# Patient Record
Sex: Female | Born: 1980 | Race: White | Hispanic: No | Marital: Single | Smoking: Never smoker
Health system: Southern US, Community
[De-identification: ages and names within clinical notes are randomized; demographics above are authoritative.]

## PROBLEM LIST (undated history)

## (undated) DIAGNOSIS — J45909 Unspecified asthma, uncomplicated: Secondary | ICD-10-CM

## (undated) DIAGNOSIS — H9190 Unspecified hearing loss, unspecified ear: Secondary | ICD-10-CM

## (undated) HISTORY — PX: OTHER SURGICAL HISTORY: SHX169

## (undated) HISTORY — PX: EYE SURGERY: SHX253

## (undated) HISTORY — PX: CLEFT PALATE REPAIR: SUR1165

## (undated) HISTORY — PX: CHOLECYSTECTOMY: SHX55

---

## 2014-01-31 ENCOUNTER — Emergency Department (HOSPITAL_COMMUNITY): Payer: Medicare Other

## 2014-01-31 ENCOUNTER — Emergency Department (HOSPITAL_COMMUNITY)
Admission: EM | Admit: 2014-01-31 | Discharge: 2014-01-31 | Disposition: A | Discharge status: Home / Self Care | Payer: Medicare Other | Attending: Emergency Medicine | Admitting: Emergency Medicine

## 2014-01-31 ENCOUNTER — Encounter (HOSPITAL_COMMUNITY): Payer: Self-pay

## 2014-01-31 DIAGNOSIS — R05 Cough: Secondary | ICD-10-CM

## 2014-01-31 DIAGNOSIS — R059 Cough, unspecified: Secondary | ICD-10-CM

## 2014-01-31 DIAGNOSIS — J45901 Unspecified asthma with (acute) exacerbation: Secondary | ICD-10-CM | POA: Insufficient documentation

## 2014-01-31 HISTORY — DX: Unspecified asthma, uncomplicated: J45.909

## 2014-01-31 MED ORDER — ALBUTEROL (5 MG/ML) CONTINUOUS INHALATION SOLN: 10.0000 mg/h | INHALATION_SOLUTION | Freq: Once | RESPIRATORY_TRACT | Status: DC

## 2014-01-31 MED ORDER — DM-GUAIFENESIN ER 30-600 MG PO TB12: 1.0000 | ORAL_TABLET | Freq: Two times a day (BID) | ORAL | Status: DC

## 2014-01-31 MED ORDER — ALBUTEROL SULFATE (2.5 MG/3ML) 0.083% IN NEBU
5.0000 mg | INHALATION_SOLUTION | Freq: Once | RESPIRATORY_TRACT | Status: AC
  Administered 2014-01-31: 5 mg via RESPIRATORY_TRACT
  Filled 2014-01-31: qty 6

## 2014-01-31 NOTE — ED Provider Notes (Signed)
CSN: [id number]     Arrival date & time 01/31/14  1118 History   First MD Initiated Contact with Patient 01/31/14 1225     Chief Complaint  Patient presents with  . Cough  . Emesis     (Consider location/radiation/quality/duration/timing/severity/associated sxs/prior Treatment) HPI Brandy Freeman is a 33 y.o. female with a history of asthma comes in for evaluation of cough. Patient is deaf and history of present illness is given via sign language interpreter. Patient states for the past week she has had a cough that is somewhat productive. She denies any hemoptysis. She also reports a runny nose, but "it is very mild". She tried taking over-the-counter cold and flu medication for 3 days, but that seemed to worsen her symptoms. She tried using her albuterol inhaler last night to help with her breathing and that provided some relief. She clarifies her vomiting as "just spitting up after coughing bouts". She reports many of her friends are sick with upper respiratory viruses and she feels like she has the same thing. She denies any fevers, overt nausea or vomiting, abdominal pain, chest pain, shortness of breath, sore throat.   Past Medical History  Diagnosis Date  . Asthma    Past Surgical History  Procedure Laterality Date  . Cleft palate repair    . [place]    . Eye surgery     History reviewed. No pertinent family history. History  Substance Use Topics  . Smoking status: Never Smoker   . Smokeless tobacco: Not on file  . Alcohol Use: No   OB History    No data available     Review of Systems  Constitutional: Negative for fever.  HENT: Negative for sore throat.   Eyes: Negative for visual disturbance.  Respiratory: Positive for cough. Negative for shortness of breath.   Cardiovascular: Negative for chest pain.  Gastrointestinal: Negative for abdominal pain.  Endocrine: Negative for polyuria.  Genitourinary: Negative for dysuria.  Skin: Negative for rash.  Neurological:  Negative for headaches.      Allergies  Review of patient's allergies indicates no known allergies.  Home Medications   Prior to Admission medications   Medication Sig Start Date End Date Taking? Authorizing Provider  ibuprofen (ADVIL,MOTRIN) 200 MG tablet Take 200-800 mg by mouth every 6 (six) hours as needed for fever or moderate pain.   Yes Historical Provider, MD  Nutritional Supplements (COLD AND FLU PO) Take 15-30 mLs by mouth every 4 (four) hours as needed (for cold symptoms).   Yes Historical Provider, MD  dextromethorphan-guaiFENesin (MUCINEX DM) 30-600 MG per 12 hr tablet Take 1 tablet by mouth 2 (two) times daily. 01/31/14   Earle GellBenjamin W Faaris Arizpe, PA-C   BP 120/87 mmHg  Pulse 79  Temp(Src) 98.1 F (36.7 C) (Oral)  Resp 18  SpO2 99%  LMP  Physical Exam  Constitutional: She is oriented to person, place, and time. She appears well-developed and well-nourished. No distress.  HENT:  Head: Normocephalic and atraumatic.  Mouth/Throat: Oropharynx is clear and moist. No oropharyngeal exudate.  Eyes: Conjunctivae are normal. Pupils are equal, round, and reactive to light. Right eye exhibits no discharge. Left eye exhibits no discharge. No scleral icterus.  Neck: Normal range of motion. Neck supple. No tracheal deviation present.  Cardiovascular: Normal rate, regular rhythm, normal heart sounds and intact distal pulses.  Exam reveals no gallop and no friction rub.   No murmur heard. Pulmonary/Chest: Effort normal and breath sounds normal. No stridor. No respiratory distress. She  has no wheezes. She has no rales.  Mild diffuse wheezing in all fields. No other adventitious lung sounds. Chest rise appropriate and symmetrical with respiration. No evidence of respiratory distress.  Abdominal: Soft. There is no tenderness.  Musculoskeletal: Normal range of motion. She exhibits no edema or tenderness.  Lymphadenopathy:    She has no cervical adenopathy.  Neurological: She is alert and  oriented to person, place, and time.  Cranial Nerves II-XII grossly intact  Skin: Skin is warm and dry. No rash noted. She is not diaphoretic.  Psychiatric: She has a normal mood and affect.  Nursing note and vitals reviewed.   ED Course  Procedures (including critical care time) Labs Review Labs Reviewed - No data to display  Imaging Review Dg Chest 2 View  01/31/2014   CLINICAL DATA:  Cough, shortness of breath and wheezing.  EXAM: CHEST  2 VIEW  COMPARISON:  03/06/2013.  FINDINGS: Normal sized heart. Clear lungs. Mild central peribronchial thickening with mild progression. The previously seen nodular densities on the right are no longer demonstrated. Unremarkable bones.  IMPRESSION: Mild bronchitic changes with mild progression.   Electronically Signed   By: Gordan PaymentSteve  Reid M.D.   On: 01/31/2014 13:08     EKG Interpretation None     Meds given in ED:  Medications  albuterol (PROVENTIL) (2.5 MG/3ML) 0.083% nebulizer solution 5 mg (5 mg Nebulization Given 01/31/14 1234)    New Prescriptions   DEXTROMETHORPHAN-GUAIFENESIN (MUCINEX DM) 30-600 MG PER 12 HR TABLET    Take 1 tablet by mouth 2 (two) times daily.   Filed Vitals:   01/31/14 1137  BP: 120/87  Pulse: 79  Temp: 98.1 F (36.7 C)  TempSrc: Oral  Resp: 18  SpO2: 99%    MDM  Vitals stable - WNL -afebrile, O2 saturations 99% Pt resting comfortably in ED. states she feels better after breathing treatment and does not want another one. Says she would prefer to go home and continue to manage cough symptomatically. Reports she still has albuterol inhaler at home and does not need a refill PE-patient still has mild wheezing diffusely after albuterol, but no evidence of respiratory distress. No concern for other acute or emergent pathology  Imaging-chest x-ray shows mild bronchitic changes but no focal pneumonias or effusions.  Will DC with antitussives Discussed f/u with PCP and return precautions, pt very amenable to  plan.   Final diagnoses:  Cough        Sharlene MottsBenjamin W Jawanda Passey, PA-C 01/31/14 1852  Dione Boozeavid Glick, MD 02/01/14 [phone]

## 2014-01-31 NOTE — Discharge Instructions (Signed)
Cough, Adult  A cough is a reflex that helps clear your throat and airways. It can help heal the body or may be a reaction to an irritated airway. A cough may only last 2 or 3 weeks (acute) or may last more than 8 weeks (chronic).  CAUSES Acute cough:  Viral or bacterial infections. Chronic cough:  Infections.  Allergies.  Asthma.  Post-nasal drip.  Smoking.  Heartburn or acid reflux.  Some medicines.  Chronic lung problems (COPD).  Cancer. SYMPTOMS   Cough.  Fever.  Chest pain.  Increased breathing rate.  High-pitched whistling sound when breathing (wheezing).  Colored mucus that you cough up (sputum). TREATMENT   A bacterial cough may be treated with antibiotic medicine.  A viral cough must run its course and will not respond to antibiotics.  Your caregiver may recommend other treatments if you have a chronic cough. HOME CARE INSTRUCTIONS   Only take over-the-counter or prescription medicines for pain, discomfort, or fever as directed by your caregiver. Use cough suppressants only as directed by your caregiver.  Use a cold steam vaporizer or humidifier in your bedroom or home to help loosen secretions.  Sleep in a semi-upright position if your cough is worse at night.  Rest as needed.  Stop smoking if you smoke. SEEK IMMEDIATE MEDICAL CARE IF:   You have pus in your sputum.  Your cough starts to worsen.  You cannot control your cough with suppressants and are losing sleep.  You begin coughing up blood.  You have difficulty breathing.  You develop pain which is getting worse or is uncontrolled with medicine.  You have a fever. MAKE SURE YOU:   Understand these instructions.  Will watch your condition.  Will get help right away if you are not doing well or get worse. Document Released: 08/20/2010 Document Revised: 05/16/2011 Document Reviewed: 08/20/2010 [employer] Patient Information 2015 [place] [place]. This information is not intended  to replace advice given to you by your health care provider. Make sure you discuss any questions you have with your health care provider.   You were seen in the ED today for your cough. There was no emergent pathology discovered for your cough symptoms. Please take your medication as directed to manage your cough. You may follow-up with primary care within the next 3-5 days for further evaluation and management. Return to ER for worsening symptoms, difficulties breathing or shortness of breath, fevers at home.

## 2014-01-31 NOTE — ED Notes (Signed)
Pt is deaf.  Pt writes that she has had a cough for 1 week.  Vomiting with cough.  Using inhaler with no help.  No fever at home

## 2014-01-31 NOTE — ED Notes (Signed)
Interpreter in with patient.

## 2014-04-19 ENCOUNTER — Emergency Department (HOSPITAL_COMMUNITY)
Admission: EM | Admit: 2014-04-19 | Discharge: 2014-04-19 | Disposition: A | Discharge status: Home / Self Care | Payer: Medicare Other | Attending: Emergency Medicine | Admitting: Emergency Medicine

## 2014-04-19 ENCOUNTER — Encounter (HOSPITAL_COMMUNITY): Payer: Self-pay | Admitting: Emergency Medicine

## 2014-04-19 DIAGNOSIS — J45909 Unspecified asthma, uncomplicated: Secondary | ICD-10-CM | POA: Insufficient documentation

## 2014-04-19 DIAGNOSIS — Y9289 Other specified places as the place of occurrence of the external cause: Secondary | ICD-10-CM | POA: Insufficient documentation

## 2014-04-19 DIAGNOSIS — S39012A Strain of muscle, fascia and tendon of lower back, initial encounter: Secondary | ICD-10-CM | POA: Diagnosis not present

## 2014-04-19 DIAGNOSIS — Y998 Other external cause status: Secondary | ICD-10-CM | POA: Diagnosis not present

## 2014-04-19 DIAGNOSIS — T148XXA Other injury of unspecified body region, initial encounter: Secondary | ICD-10-CM

## 2014-04-19 DIAGNOSIS — Z79899 Other long term (current) drug therapy: Secondary | ICD-10-CM | POA: Diagnosis not present

## 2014-04-19 DIAGNOSIS — Y9389 Activity, other specified: Secondary | ICD-10-CM | POA: Insufficient documentation

## 2014-04-19 DIAGNOSIS — X58XXXA Exposure to other specified factors, initial encounter: Secondary | ICD-10-CM | POA: Diagnosis not present

## 2014-04-19 DIAGNOSIS — S3992XA Unspecified injury of lower back, initial encounter: Secondary | ICD-10-CM | POA: Diagnosis present

## 2014-04-19 MED ORDER — HYDROCODONE-ACETAMINOPHEN 5-325 MG PO TABS
1.0000 | ORAL_TABLET | Freq: Once | ORAL | Status: AC
  Administered 2014-04-19: 1 via ORAL
  Filled 2014-04-19: qty 1

## 2014-04-19 MED ORDER — CYCLOBENZAPRINE HCL 5 MG PO TABS: 5.0000 mg | ORAL_TABLET | Freq: Three times a day (TID) | ORAL | Status: DC | PRN

## 2014-04-19 MED ORDER — CYCLOBENZAPRINE HCL 10 MG PO TABS
5.0000 mg | ORAL_TABLET | Freq: Once | ORAL | Status: AC
  Administered 2014-04-19: 5 mg via ORAL
  Filled 2014-04-19: qty 1

## 2014-04-19 MED ORDER — HYDROCODONE-ACETAMINOPHEN 5-325 MG PO TABS: 1.0000 | ORAL_TABLET | ORAL | Status: DC | PRN

## 2014-04-19 NOTE — ED Notes (Signed)
Sign language interpreter at bedside  
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
## 2014-04-19 NOTE — Discharge Instructions (Signed)

## 2014-04-30 ENCOUNTER — Emergency Department (HOSPITAL_COMMUNITY): Payer: Medicare Other

## 2014-04-30 ENCOUNTER — Emergency Department (HOSPITAL_COMMUNITY)
Admission: EM | Admit: 2014-04-30 | Discharge: 2014-04-30 | Disposition: A | Discharge status: Home / Self Care | Payer: Medicare Other | Attending: Emergency Medicine | Admitting: Emergency Medicine

## 2014-04-30 ENCOUNTER — Encounter (HOSPITAL_COMMUNITY): Payer: Self-pay | Admitting: Emergency Medicine

## 2014-04-30 DIAGNOSIS — R1110 Vomiting, unspecified: Secondary | ICD-10-CM | POA: Diagnosis present

## 2014-04-30 DIAGNOSIS — Z3202 Encounter for pregnancy test, result negative: Secondary | ICD-10-CM | POA: Diagnosis not present

## 2014-04-30 DIAGNOSIS — J45901 Unspecified asthma with (acute) exacerbation: Secondary | ICD-10-CM | POA: Insufficient documentation

## 2014-04-30 DIAGNOSIS — N12 Tubulo-interstitial nephritis, not specified as acute or chronic: Secondary | ICD-10-CM | POA: Insufficient documentation

## 2014-04-30 DIAGNOSIS — Z79899 Other long term (current) drug therapy: Secondary | ICD-10-CM | POA: Insufficient documentation

## 2014-04-30 LAB — CBC WITH DIFFERENTIAL/PLATELET
Basophils Absolute: 0 10*3/uL (ref 0.0–0.1)
Basophils Relative: 0 % (ref 0–1)
Eosinophils Absolute: 0 10*3/uL (ref 0.0–0.7)
Eosinophils Relative: 0 % (ref 0–5)
HCT: 30.4 % — ABNORMAL LOW (ref 36.0–46.0)
Hemoglobin: 10.1 g/dL — ABNORMAL LOW (ref 12.0–15.0)
Lymphocytes Relative: 5 % — ABNORMAL LOW (ref 12–46)
Lymphs Abs: 0.7 10*3/uL (ref 0.7–4.0)
MCH: 29.1 pg (ref 26.0–34.0)
MCHC: 33.2 g/dL (ref 30.0–36.0)
MCV: 87.6 fL (ref 78.0–100.0)
Monocytes Absolute: 1.3 10*3/uL — ABNORMAL HIGH (ref 0.1–1.0)
Monocytes Relative: 10 % (ref 3–12)
Neutro Abs: 11.4 10*3/uL — ABNORMAL HIGH (ref 1.7–7.7)
Neutrophils Relative %: 85 % — ABNORMAL HIGH (ref 43–77)
Platelets: 155 10*3/uL (ref 150–400)
RBC: 3.47 MIL/uL — ABNORMAL LOW (ref 3.87–5.11)
RDW: 13.3 % (ref 11.5–15.5)
WBC: 13.4 10*3/uL — ABNORMAL HIGH (ref 4.0–10.5)

## 2014-04-30 LAB — URINE MICROSCOPIC-ADD ON

## 2014-04-30 LAB — COMPREHENSIVE METABOLIC PANEL
ALT: 68 U/L — ABNORMAL HIGH (ref 0–35)
AST: 60 U/L — ABNORMAL HIGH (ref 0–37)
Albumin: 3.1 g/dL — ABNORMAL LOW (ref 3.5–5.2)
Alkaline Phosphatase: 108 U/L (ref 39–117)
Anion gap: 10 (ref 5–15)
BUN: 19 mg/dL (ref 6–23)
CO2: 26 mmol/L (ref 19–32)
Calcium: 9.6 mg/dL (ref 8.4–10.5)
Chloride: 97 mmol/L (ref 96–112)
Creatinine, Ser: 0.68 mg/dL (ref 0.50–1.10)
GFR calc Af Amer: >90 mL/min (ref >90)
GFR calc non Af Amer: >90 mL/min (ref >90)
Glucose, Bld: 115 mg/dL — ABNORMAL HIGH (ref 70–99)
Potassium: 3.1 mmol/L — ABNORMAL LOW (ref 3.5–5.1)
Sodium: 133 mmol/L — ABNORMAL LOW (ref 135–145)
Total Bilirubin: 1 mg/dL (ref 0.3–1.2)
Total Protein: 7.2 g/dL (ref 6.0–8.3)

## 2014-04-30 LAB — URINALYSIS, ROUTINE W REFLEX MICROSCOPIC
Glucose, UA: NEGATIVE mg/dL
Ketones, ur: 40 mg/dL — AB
Nitrite: POSITIVE — AB
Protein, ur: 100 mg/dL — AB
Specific Gravity, Urine: 1.022 (ref 1.005–1.030)
Urobilinogen, UA: 2 mg/dL — ABNORMAL HIGH (ref 0.0–1.0)
pH: 5.5 (ref 5.0–8.0)

## 2014-04-30 LAB — LIPASE, BLOOD: Lipase: 28 U/L (ref 11–59)

## 2014-04-30 LAB — POC URINE PREG, ED: Preg Test, Ur: NEGATIVE

## 2014-04-30 MED ORDER — SODIUM CHLORIDE 0.9 % IV BOLUS (SEPSIS)
1000.0000 mL | Freq: Once | INTRAVENOUS | Status: AC
  Administered 2014-04-30: 1000 mL via INTRAVENOUS

## 2014-04-30 MED ORDER — PROMETHAZINE HCL 25 MG PO TABS: 25.0000 mg | ORAL_TABLET | Freq: Four times a day (QID) | ORAL | Status: DC | PRN

## 2014-04-30 MED ORDER — ONDANSETRON HCL 4 MG/2ML IJ SOLN
4.0000 mg | Freq: Once | INTRAMUSCULAR | Status: AC
  Administered 2014-04-30: 4 mg via INTRAVENOUS
  Filled 2014-04-30: qty 2

## 2014-04-30 MED ORDER — KETOROLAC TROMETHAMINE 60 MG/2ML IM SOLN
60.0000 mg | Freq: Once | INTRAMUSCULAR | Status: AC
  Administered 2014-04-30: 60 mg via INTRAMUSCULAR
  Filled 2014-04-30: qty 2

## 2014-04-30 MED ORDER — PHENAZOPYRIDINE HCL 200 MG PO TABS: 200.0000 mg | ORAL_TABLET | Freq: Three times a day (TID) | ORAL | Status: DC

## 2014-04-30 MED ORDER — CEPHALEXIN 500 MG PO CAPS: 500.0000 mg | ORAL_CAPSULE | Freq: Three times a day (TID) | ORAL | Status: DC

## 2014-04-30 MED ORDER — IBUPROFEN 600 MG PO TABS: 600.0000 mg | ORAL_TABLET | Freq: Four times a day (QID) | ORAL | Status: DC | PRN

## 2014-04-30 MED ORDER — IPRATROPIUM-ALBUTEROL 0.5-2.5 (3) MG/3ML IN SOLN
3.0000 mL | Freq: Once | RESPIRATORY_TRACT | Status: AC
  Administered 2014-04-30: 3 mL via RESPIRATORY_TRACT
  Filled 2014-04-30: qty 3

## 2014-04-30 MED ORDER — CEFTRIAXONE SODIUM 1 G IJ SOLR
1.0000 g | Freq: Once | INTRAMUSCULAR | Status: AC
  Administered 2014-04-30: 1 g via INTRAMUSCULAR
  Filled 2014-04-30: qty 10

## 2014-04-30 NOTE — ED Notes (Signed)
Nurse drawing labs. 

## 2014-04-30 NOTE — ED Notes (Signed)
Pt is deaf and mute, uses sign language. Pt c/o vomiting and not feeling good for the past 5 days, states she has been shaking.

## 2014-04-30 NOTE — ED Notes (Signed)
Gave pt Sprite. Pt taking sips at this time.

## 2014-04-30 NOTE — ED Notes (Signed)
Patient is unable to give urine sample at this time. 

## 2014-04-30 NOTE — ED Notes (Signed)
Patient getting a breathing treatment when she finish the treatment she will try to give us a sample

## 2014-04-30 NOTE — ED Provider Notes (Signed)
CSN: [id number]     Arrival date & time 04/30/14  1519 History   First MD Initiated Contact with Patient 04/30/14 1545     Chief Complaint  Patient presents with  . Emesis     (Consider location/radiation/quality/duration/timing/severity/associated sxs/prior Treatment) The history is provided by the patient. The history is limited by a language barrier. A language interpreter was used (Sign language interpreter).   Pt is a 34yo female with hx of asthma, deaf and mute, uses sign language, presenting to ED with c/o vomiting and not feeling good for 5 days. Pt states she believes she has a UTI as she has had increased urinary urgency and frequency for 5 days.  She has also had generalized cramping, abdominal pain, nausea and vomiting. States she cannot keep anything down.  No associated diarrhea. She has had cough, congestion and SOB with left sided chest pain with her cough. She has had hot and cold chills, taking ibuprofen and acetaminophen with moderate relief.  Family in room was also sick with vomiting 2 days ago that required IV fluids but they are currently pregnant and not sure if the n/v was due to the pregnancy or sickness.  No recent travel. No hx of abdominal surgeries. No hx of kidney stones. Denies vaginal symptoms.     Past Medical History  Diagnosis Date  . Asthma    Past Surgical History  Procedure Laterality Date  . Cleft palate repair    . [place]    . Eye surgery     No family history on file. History  Substance Use Topics  . Smoking status: Never Smoker   . Smokeless tobacco: Not on file  . Alcohol Use: No   OB History    No data available     Review of Systems  Constitutional: Positive for fever, chills, appetite change and fatigue. Negative for diaphoresis.  HENT: Positive for congestion.   Respiratory: Positive for cough, chest tightness, shortness of breath and wheezing. Negative for stridor.   Cardiovascular: Positive for chest pain ( left side with  breathing). Negative for palpitations and leg swelling.  Gastrointestinal: Positive for nausea, vomiting and abdominal pain ( generalized). Negative for diarrhea.  Genitourinary: Positive for dysuria, urgency and frequency. Negative for hematuria, flank pain, decreased urine volume, vaginal bleeding, vaginal discharge, vaginal pain and pelvic pain.  Musculoskeletal: Positive for back pain ( lower back). Negative for myalgias, neck pain and neck stiffness.  All other systems reviewed and are negative.     Allergies  Review of patient's allergies indicates no known allergies.  Home Medications   Prior to Admission medications   Medication Sig Start Date End Date Taking? Authorizing Provider  albuterol (PROVENTIL HFA;VENTOLIN HFA) 108 (90 BASE) MCG/ACT inhaler Inhale 2 puffs into the lungs every 6 (six) hours as needed for wheezing or shortness of breath (wheezing and shortness of breath).    Yes Historical Provider, MD  cyclobenzaprine (FLEXERIL) 5 MG tablet Take 1 tablet (5 mg total) by mouth 3 (three) times daily as needed for muscle spasms. 04/19/14  Yes Teressa LowerVrinda Pickering, NP  dextromethorphan-guaiFENesin (MUCINEX DM) 30-600 MG per 12 hr tablet Take 1 tablet by mouth 2 (two) times daily. 01/31/14  Yes Earle GellBenjamin W Cartner, PA-C  HYDROcodone-acetaminophen (NORCO/VICODIN) 5-325 MG per tablet Take 1-2 tablets by mouth every 4 (four) hours as needed. 04/19/14  Yes Teressa LowerVrinda Pickering, NP  ibuprofen (ADVIL,MOTRIN) 200 MG tablet Take 200 mg by mouth every 6 (six) hours as needed for fever or moderate  pain (back pain).    Yes Historical Provider, MD  cephALEXin (KEFLEX) 500 MG capsule Take 1 capsule (500 mg total) by mouth 3 (three) times daily. 04/30/14   Junius Finner, PA-C  ibuprofen (ADVIL,MOTRIN) 600 MG tablet Take 1 tablet (600 mg total) by mouth every 6 (six) hours as needed. 04/30/14   Junius Finner, PA-C  phenazopyridine (PYRIDIUM) 200 MG tablet Take 1 tablet (200 mg total) by mouth 3 (three) times  daily. 04/30/14   Junius Finner, PA-C  promethazine (PHENERGAN) 25 MG tablet Take 1 tablet (25 mg total) by mouth every 6 (six) hours as needed for nausea or vomiting. 04/30/14   Junius Finner, PA-C   BP 116/87 mmHg  Pulse 97  Temp(Src) 100.1 F (37.8 C) (Oral)  Resp 17  SpO2 96% Physical Exam  Constitutional: She appears well-developed and well-nourished. She appears distressed.  Pt lying in exam bed, tearful  HENT:  Head: Normocephalic and atraumatic.  Eyes: Conjunctivae are normal. No scleral icterus.  Neck: Normal range of motion. Neck supple.  Cardiovascular: Normal rate, regular rhythm and normal heart sounds.   Pulmonary/Chest: Effort normal and breath sounds normal. No respiratory distress. She has no wheezes. She has no rales. She exhibits no tenderness.  Abdominal: Soft. Bowel sounds are normal. She exhibits no distension and no mass. There is tenderness (diffuse). There is CVA tenderness ( left). There is no rebound and no guarding.  Soft, non-distended. Diffuse tenderness. Left: CVAT  Musculoskeletal: Normal range of motion.  Neurological: She is alert.  Skin: Skin is warm and dry. She is not diaphoretic.  Nursing note and vitals reviewed.   ED Course  Procedures (including critical care time) Labs Review Labs Reviewed  CBC WITH DIFFERENTIAL/PLATELET - Abnormal; Notable for the following:    WBC 13.4 (*)    RBC 3.47 (*)    Hemoglobin 10.1 (*)    HCT 30.4 (*)    Neutrophils Relative % 85 (*)    Neutro Abs 11.4 (*)    Lymphocytes Relative 5 (*)    Monocytes Absolute 1.3 (*)    All other components within normal limits  COMPREHENSIVE METABOLIC PANEL - Abnormal; Notable for the following:    Sodium 133 (*)    Potassium 3.1 (*)    Glucose, Bld 115 (*)    Albumin 3.1 (*)    AST 60 (*)    ALT 68 (*)    All other components within normal limits  URINALYSIS, ROUTINE W REFLEX MICROSCOPIC - Abnormal; Notable for the following:    Color, Urine AMBER (*)    APPearance  CLOUDY (*)    Hgb urine dipstick SMALL (*)    Bilirubin Urine SMALL (*)    Ketones, ur 40 (*)    Protein, ur 100 (*)    Urobilinogen, UA 2.0 (*)    Nitrite POSITIVE (*)    Leukocytes, UA MODERATE (*)    All other components within normal limits  URINE MICROSCOPIC-ADD ON - Abnormal; Notable for the following:    Squamous Epithelial / LPF FEW (*)    Bacteria, UA MANY (*)    Casts HYALINE CASTS (*)    All other components within normal limits  URINE CULTURE  LIPASE, BLOOD  POC URINE PREG, ED    Imaging Review Dg Chest 2 View  04/30/2014   CLINICAL DATA:  34 year old female with vomiting and fever for 5 days. Initial encounter.  EXAM: CHEST  2 VIEW  COMPARISON:  01/31/2014 and earlier.  FINDINGS: Lower lung  volumes. Normal cardiac size and mediastinal contours. Visualized tracheal air column is within normal limits. No pneumothorax or pneumoperitoneum. The lungs are clear. No pleural effusion. Negative visible bowel gas pattern. No osseous abnormality identified.  IMPRESSION: Mildly lower lung volumes otherwise Negative, no acute cardiopulmonary abnormality.   Electronically Signed   By: Odessa Fleming M.D.   On: 04/30/2014 16:55     EKG Interpretation None      MDM   Final diagnoses:  Pyelonephritis  Asthma exacerbation    Pt with hx of asthma presenting to ED with c/o cough, SOB, as well as urinary symptoms, abdominal pain and vomiting for 5 days.  Labs: mild hypokalemia of 3.1, mild leukocytosis 13.4, mild elevation in AST/ALT w/o focal tenderness to RUQ.  Low concern for cholecystitis.  Abdomen is soft, non-distended, diffuse tenderness. Improved with IV zofran and IV fluids.  Pt requested IV removed after receiving . Duoneb given which did help with SOB, Lungs: CTAB.  CXR: mildly lower lung volumes, otherwise, negative. No acute cardiopulmonary abnormality.   UA: significant for evidence of UTI with positive nitrites, moderate leukocytes, many bacteria.    With pt having  vomiting and left CVAT, will tx for pyelonephrosis with longer duration of keflex.  Rocephin 1g IM given in ED.  Pt able to keep down PO fluids. Stable for discharge home to f/u with PCP.   Return precautions provided. Pt verbalized understanding and agreement with tx plan.        Junius Finner, PA-C 04/30/14 1924  Gwyneth Sprout, MD 05/02/14 [phone]

## 2014-05-04 LAB — URINE CULTURE: Colony Count: >=100000

## 2014-05-05 ENCOUNTER — Telehealth (HOSPITAL_BASED_OUTPATIENT_CLINIC_OR_DEPARTMENT_OTHER): Payer: Self-pay | Admitting: Emergency Medicine

## 2014-05-05 NOTE — Telephone Encounter (Signed)
Post ED Visit - Positive Culture Follow-up  Culture report reviewed by antimicrobial stewardship pharmacist: []  Wes Dulaney, Pharm.D., BCPS [x]  Celedonio MiyamotoJeremy Frens, Pharm.D., BCPS []  Georgina PillionElizabeth Martin, Pharm.D., BCPS []  Mesquite CreekMinh Pham, [address]Ph[address]D., BCPS, AAHIVP []  Estella HuskMichelle Turner, Pharm.D., BCPS, AAHIVP []  Elder CyphersLorie Poole, [address]Pharm.D., BCPS  Positive urine culture E. Coli Treated with cephalexin, organism sensitive to the same and no further patient follow-up is required at this time.  Berle MullMiller, Venida Tsukamoto 05/05/2014, 10:47 AM

## 2014-07-25 ENCOUNTER — Emergency Department (HOSPITAL_COMMUNITY): Payer: Medicare Other

## 2014-07-25 ENCOUNTER — Emergency Department (HOSPITAL_COMMUNITY)
Admission: EM | Admit: 2014-07-25 | Discharge: 2014-07-25 | Disposition: A | Discharge status: Home / Self Care | Payer: Medicare Other | Attending: Emergency Medicine | Admitting: Emergency Medicine

## 2014-07-25 ENCOUNTER — Encounter (HOSPITAL_COMMUNITY): Payer: Self-pay | Admitting: *Deleted

## 2014-07-25 DIAGNOSIS — J45909 Unspecified asthma, uncomplicated: Secondary | ICD-10-CM | POA: Diagnosis not present

## 2014-07-25 DIAGNOSIS — Z79899 Other long term (current) drug therapy: Secondary | ICD-10-CM | POA: Diagnosis not present

## 2014-07-25 DIAGNOSIS — Z792 Long term (current) use of antibiotics: Secondary | ICD-10-CM | POA: Insufficient documentation

## 2014-07-25 DIAGNOSIS — J029 Acute pharyngitis, unspecified: Secondary | ICD-10-CM | POA: Diagnosis present

## 2014-07-25 DIAGNOSIS — J40 Bronchitis, not specified as acute or chronic: Secondary | ICD-10-CM

## 2014-07-25 MED ORDER — BENZONATATE 100 MG PO CAPS: 100.0000 mg | ORAL_CAPSULE | Freq: Three times a day (TID) | ORAL | Status: DC

## 2014-07-25 NOTE — ED Notes (Signed)
Patient communicates via sign interpreter that she has had a cold and cough with sore throat for about a week. Her boyfriend has had similar symptoms. Patient has only begun taking over the counter nyquil last night. Patient has been coughing a lot and unable to sleep.

## 2014-07-25 NOTE — Discharge Instructions (Signed)

## 2014-07-25 NOTE — ED Provider Notes (Signed)
CSN: [id number]     Arrival date & time 07/25/14  0801 History   First MD Initiated Contact with Patient 07/25/14 [phone]     Chief Complaint  Patient presents with  . Cough  . Sore Throat      HPI Patient presents emergency department with productive cough over the past several days.  She reports upper respiratory symptoms as well as chills at home without documented fever.  Her boyfriend has similar symptoms and was recently seen in the emergency department 30 minutes ago for similar symptoms.  Patient decided to check herself in.  She denies nausea or vomiting.  No diarrhea.  No chest pain.  Denies shortness of breath.  She does have a history of asthma but has not tried her albuterol inhaler yet.   Past Medical History  Diagnosis Date  . Asthma    Past Surgical History  Procedure Laterality Date  . Cleft palate repair    . [place]    . Eye surgery     No family history on file. History  Substance Use Topics  . Smoking status: Never Smoker   . Smokeless tobacco: Not on file  . Alcohol Use: No   OB History    No data available     Review of Systems  All other systems reviewed and are negative.     Allergies  Review of patient's allergies indicates no known allergies.  Home Medications   Prior to Admission medications   Medication Sig Start Date End Date Taking? Authorizing Provider  albuterol (PROVENTIL HFA;VENTOLIN HFA) 108 (90 BASE) MCG/ACT inhaler Inhale 2 puffs into the lungs every 6 (six) hours as needed for wheezing or shortness of breath (wheezing and shortness of breath).    Yes Historical Provider, MD  ibuprofen (ADVIL,MOTRIN) 200 MG tablet Take 400 mg by mouth every 4 (four) hours as needed for fever or moderate pain (back pain).    Yes Historical Provider, MD  Phenyleph-Doxylamine-DM-APAP 5-6.25-10-325 MG/15ML LIQD Take 30 mLs by mouth at bedtime as needed (for sleep/cold).   Yes Historical Provider, MD  cephALEXin (KEFLEX) 500 MG capsule Take 1 capsule  (500 mg total) by mouth 3 (three) times daily. Patient not taking: Reported on 07/25/2014 04/30/14   Junius FinnerErin O'Malley, PA-C  cyclobenzaprine (FLEXERIL) 5 MG tablet Take 1 tablet (5 mg total) by mouth 3 (three) times daily as needed for muscle spasms. Patient not taking: Reported on 07/25/2014 04/19/14   Teressa LowerVrinda Pickering, NP  dextromethorphan-guaiFENesin [facility](MUCINEX DM) 30-600 MG per 12 hr tablet Take 1 tablet by mouth 2 (two) times daily. Patient not taking: Reported on 07/25/2014 01/31/14   Joycie PeekBenjamin Cartner, PA-C  HYDROcodone-acetaminophen (NORCO/VICODIN) 5-325 MG per tablet Take 1-2 tablets by mouth every 4 (four) hours as needed. Patient not taking: Reported on 07/25/2014 04/19/14   Teressa LowerVrinda Pickering, NP  ibuprofen (ADVIL,MOTRIN) 600 MG tablet Take 1 tablet (600 mg total) by mouth every 6 (six) hours as needed. Patient not taking: Reported on 07/25/2014 04/30/14   Junius FinnerErin O'Malley, PA-C  phenazopyridine (PYRIDIUM) 200 MG tablet Take 1 tablet (200 mg total) by mouth 3 (three) times daily. Patient not taking: Reported on 07/25/2014 04/30/14   Junius FinnerErin O'Malley, PA-C  promethazine (PHENERGAN) 25 MG tablet Take 1 tablet (25 mg total) by mouth every 6 (six) hours as needed for nausea or vomiting. Patient not taking: Reported on 07/25/2014 04/30/14   Junius FinnerErin O'Malley, PA-C   BP 105/80 mmHg  Pulse 72  Temp(Src) 97.7 F (36.5 C) (Oral)  Resp 16  SpO2 100% Physical Exam  Constitutional: She is oriented to person, place, and time. She appears well-developed and well-nourished. No distress.  HENT:  Head: Normocephalic and atraumatic.  Eyes: EOM are normal.  Neck: Normal range of motion.  Cardiovascular: Normal rate, regular rhythm and normal heart sounds.   Pulmonary/Chest: Effort normal and breath sounds normal. She has no wheezes.  Abdominal: Soft. She exhibits no distension. There is no tenderness.  Musculoskeletal: Normal range of motion.  Neurological: She is alert and oriented to person, place, and time.  Skin: Skin  is warm and dry.  Psychiatric: She has a normal mood and affect. Judgment normal.  Nursing note and vitals reviewed.   ED Course  Procedures (including critical care time) Labs Review Labs Reviewed - No data to display  Imaging Review Dg Chest 2 View (if Patient Has Fever And/or Copd)  07/25/2014   CLINICAL DATA:  Cough, chest congestion, and sore throat.  EXAM: CHEST  2 VIEW  COMPARISON:  04/30/2014  FINDINGS: The heart size and mediastinal contours are within normal limits. Both lungs are clear. The visualized skeletal structures are unremarkable.  IMPRESSION: Normal chest.   Electronically Signed   By: Francene BoyersJames  Maxwell M.D.   On: 07/25/2014 09:18     EKG Interpretation None      MDM   Final diagnoses:  None    Suspect bronchitis with bronchospasm.  The patient took 2 puffs of her own albuterol here in the emergency department with some improvement in her symptoms.  X-rays without focal pneumonia or other pathology.  Discharge home in good condition.    Azalia BilisKevin Tonjia Parillo, MD 07/25/14 [phone]

## 2014-09-04 ENCOUNTER — Emergency Department (HOSPITAL_COMMUNITY)
Admission: EM | Admit: 2014-09-04 | Discharge: 2014-09-04 | Disposition: A | Discharge status: Home / Self Care | Payer: Medicare Other | Attending: Emergency Medicine | Admitting: Emergency Medicine

## 2014-09-04 ENCOUNTER — Encounter (HOSPITAL_COMMUNITY): Payer: Self-pay | Admitting: Emergency Medicine

## 2014-09-04 DIAGNOSIS — G8921 Chronic pain due to trauma: Secondary | ICD-10-CM | POA: Insufficient documentation

## 2014-09-04 DIAGNOSIS — J45909 Unspecified asthma, uncomplicated: Secondary | ICD-10-CM | POA: Insufficient documentation

## 2014-09-04 DIAGNOSIS — Z792 Long term (current) use of antibiotics: Secondary | ICD-10-CM | POA: Diagnosis not present

## 2014-09-04 DIAGNOSIS — Z3202 Encounter for pregnancy test, result negative: Secondary | ICD-10-CM | POA: Diagnosis not present

## 2014-09-04 DIAGNOSIS — M545 Low back pain: Secondary | ICD-10-CM | POA: Diagnosis present

## 2014-09-04 DIAGNOSIS — Z79899 Other long term (current) drug therapy: Secondary | ICD-10-CM | POA: Insufficient documentation

## 2014-09-04 DIAGNOSIS — N10 Acute tubulo-interstitial nephritis: Secondary | ICD-10-CM | POA: Diagnosis not present

## 2014-09-04 LAB — COMPREHENSIVE METABOLIC PANEL
ALK PHOS: 82 U/L (ref 38–126)
ALT: 13 U/L — ABNORMAL LOW (ref 14–54)
AST: 19 U/L (ref 15–41)
Albumin: 4.1 g/dL (ref 3.5–5.0)
Anion gap: 7 (ref 5–15)
BILIRUBIN TOTAL: 0.5 mg/dL (ref 0.3–1.2)
BUN: 23 mg/dL — AB (ref 6–20)
CO2: 27 mmol/L (ref 22–32)
CREATININE: 0.88 mg/dL (ref 0.44–1.00)
Calcium: 9.8 mg/dL (ref 8.9–10.3)
Chloride: 104 mmol/L (ref 101–111)
GFR calc non Af Amer: >60 mL/min (ref >60)
Glucose, Bld: 119 mg/dL — ABNORMAL HIGH (ref 65–99)
Potassium: 3.9 mmol/L (ref 3.5–5.1)
Sodium: 138 mmol/L (ref 135–145)
Total Protein: 8 g/dL (ref 6.5–8.1)

## 2014-09-04 LAB — CBC WITH DIFFERENTIAL/PLATELET
BASOS PCT: 0 % (ref 0–1)
Basophils Absolute: 0 10*3/uL (ref 0.0–0.1)
Eosinophils Absolute: 0.2 10*3/uL (ref 0.0–0.7)
Eosinophils Relative: 2 % (ref 0–5)
HCT: 39.1 % (ref 36.0–46.0)
Hemoglobin: 12.9 g/dL (ref 12.0–15.0)
Lymphocytes Relative: 7 % — ABNORMAL LOW (ref 12–46)
Lymphs Abs: 0.6 10*3/uL — ABNORMAL LOW (ref 0.7–4.0)
MCH: 29.4 pg (ref 26.0–34.0)
MCHC: 33 g/dL (ref 30.0–36.0)
MCV: 89.1 fL (ref 78.0–100.0)
Monocytes Absolute: 0.6 10*3/uL (ref 0.1–1.0)
Monocytes Relative: 7 % (ref 3–12)
NEUTROS ABS: 7.5 10*3/uL (ref 1.7–7.7)
Neutrophils Relative %: 84 % — ABNORMAL HIGH (ref 43–77)
Platelets: 140 10*3/uL — ABNORMAL LOW (ref 150–400)
RBC: 4.39 MIL/uL (ref 3.87–5.11)
RDW: 14.3 % (ref 11.5–15.5)
WBC: 8.9 10*3/uL (ref 4.0–10.5)

## 2014-09-04 LAB — URINALYSIS, ROUTINE W REFLEX MICROSCOPIC
BILIRUBIN URINE: NEGATIVE
GLUCOSE, UA: NEGATIVE mg/dL
Ketones, ur: NEGATIVE mg/dL
NITRITE: POSITIVE — AB
PROTEIN: NEGATIVE mg/dL
Specific Gravity, Urine: 1.017 (ref 1.005–1.030)
UROBILINOGEN UA: 0.2 mg/dL (ref 0.0–1.0)
pH: 5.5 (ref 5.0–8.0)

## 2014-09-04 LAB — LIPASE, BLOOD: Lipase: 33 U/L (ref 22–51)

## 2014-09-04 LAB — URINE MICROSCOPIC-ADD ON

## 2014-09-04 LAB — POC URINE PREG, ED: Preg Test, Ur: NEGATIVE

## 2014-09-04 MED ORDER — CEPHALEXIN 500 MG PO CAPS: 500.0000 mg | ORAL_CAPSULE | Freq: Four times a day (QID) | ORAL | Status: DC

## 2014-09-04 MED ORDER — ONDANSETRON HCL 4 MG/2ML IJ SOLN
4.0000 mg | Freq: Once | INTRAMUSCULAR | Status: DC
  Filled 2014-09-04 (×2): qty 2

## 2014-09-04 MED ORDER — ACETAMINOPHEN 500 MG PO TABS
1000.0000 mg | ORAL_TABLET | Freq: Once | ORAL | Status: AC
  Administered 2014-09-04: 1000 mg via ORAL
  Filled 2014-09-04: qty 2

## 2014-09-04 MED ORDER — SODIUM CHLORIDE 0.9 % IV BOLUS (SEPSIS)
1000.0000 mL | Freq: Once | INTRAVENOUS | Status: AC
  Administered 2014-09-04: 1000 mL via INTRAVENOUS

## 2014-09-04 MED ORDER — MORPHINE SULFATE 4 MG/ML IJ SOLN
4.0000 mg | Freq: Once | INTRAMUSCULAR | Status: AC
  Administered 2014-09-04: 4 mg via INTRAVENOUS
  Filled 2014-09-04 (×2): qty 1

## 2014-09-04 MED ORDER — ONDANSETRON HCL 4 MG PO TABS: 4.0000 mg | ORAL_TABLET | Freq: Three times a day (TID) | ORAL | Status: DC | PRN

## 2014-09-04 MED ORDER — HYDROCODONE-ACETAMINOPHEN 5-325 MG PO TABS: 1.0000 | ORAL_TABLET | ORAL | Status: DC | PRN

## 2014-09-04 MED ORDER — DEXTROSE 5 % IV SOLN
1.0000 g | Freq: Once | INTRAVENOUS | Status: AC
  Administered 2014-09-04: 1 g via INTRAVENOUS
  Filled 2014-09-04: qty 10
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
## 2014-09-04 NOTE — ED Notes (Signed)
Patient refused Zofran.

## 2014-09-04 NOTE — Discharge Instructions (Signed)
Read the information below.  Use the prescribed medication as directed.  Please discuss all new medications with your pharmacist.  Do not take additional tylenol while taking the prescribed pain medication to avoid overdose.  You may return to the Emergency Department at any time for worsening condition or any new symptoms that concern you.   If you develop high fevers, worsening back or abdominal pain, uncontrolled vomiting, or are unable to tolerate fluids by mouth, return to the ER for a recheck.     Pyelonephritis, Adult Pyelonephritis is a kidney infection. A kidney infection can happen quickly, or it can last for a long time. HOME CARE   Take your medicine (antibiotics) as told. Finish it even if you start to feel better.  Keep all doctor visits as told.  Drink enough fluids to keep your pee (urine) clear or pale yellow.  Only take medicine as told by your doctor. GET HELP RIGHT AWAY IF:   You have a fever or lasting symptoms for more than 2-3 days.  You have a fever and your symptoms suddenly get worse.  You cannot take your medicine or drink fluids as told.  You have chills and shaking.  You feel very weak or pass out (faint).  You do not feel better after 2 days. MAKE SURE YOU:  Understand these instructions.  Will watch your condition.  Will get help right away if you are not doing well or get worse. Document Released: 03/31/2004 Document Revised: 08/23/2011 Document Reviewed: 08/11/2010 [facility] Patient Information 2015 [place], [place]. This information is not intended to replace advice given to you by your health care provider. Make sure you discuss any questions you have with your health care provider.   Emergency Department Resource Guide 1) Find a Doctor and Pay Out of Pocket Although you won't have to find out who is covered by your insurance plan, it is a good idea to ask around and get recommendations. You will then need to call the office and see if the  doctor you have chosen will accept you as a new patient and what types of options they offer for patients who are self-pay. Some doctors offer discounts or will set up payment plans for their patients who do not have insurance, but you will need to ask so you aren't surprised when you get to your appointment.  2) Contact Your Local Health Department Not all health departments have doctors that can see patients for sick visits, but many do, so it is worth a call to see if yours does. If you don't know where your local health department is, you can check in your phone book. The CDC also has a tool to help you locate your state's health department, and many state websites also have listings of all of their local health departments.  3) Find a Walk-in Clinic If your illness is not likely to be very severe or complicated, you may want to try a walk in clinic. These are popping up all over the country in pharmacies, drugstores, and shopping centers. They're usually staffed by nurse practitioners or physician assistants that have been trained to treat common illnesses and complaints. They're usually fairly quick and inexpensive. However, if you have serious medical issues or chronic medical problems, these are probably not your best option.  No Primary Care Doctor: - Call Health Connect at  [phone] - they can help you locate a primary care doctor that  accepts your insurance, provides certain services, etc. - Physician  Referral Service- [phone]  Chronic Pain Problems: Organization         Address  Phone   Notes  [facility]  [phone] Patients need to be referred by their primary care doctor.   Medication Assistance: Organization         Address  Phone   Notes  [facility] Medication [facility] [address] [place] [place], [place] [address] [phone] --Must be a resident of [facility] -- Must have NO insurance coverage whatsoever (no Medicaid/  Medicare, etc.) -- The pt. MUST have a primary care doctor that directs their care regularly and follows them in the community   MedAssist  [phone]   [employer]  [phone]    Agencies that provide inexpensive medical care: Organization         Address  Phone   Notes  [facility]  [phone]   [facility]    [phone]   [facility] [address] [place], [place] [address] [phone]   [facility] [address], [place] [phone]   Planned Parenthood    [phone]   [facility]    [phone]   [facility]  [address], [place] Phone:  [phone], Fax:  [phone] Hours of Operation:  9 am - 6 pm, M-F.  Also accepts Medicaid/Medicare and self-pay.  [facility]  [address], [place] Phone: [phone], Fax: [phone]. Hours of Operation:  8:30 am - 5:30 pm, M-F.  Also accepts Medicaid and self-pay.  [facility] [place] [address], [place] Phone: [phone]   [facility] [address] Natasha Bence [place], [place] [phone], Ext. [phone] Mondays & Thursdays: 7-9 AM.  First 15 patients are seen on a first come, first serve basis.    Medicaid-accepting [facility] Providers:  Organization         Address  Phone   Notes  [facility] [address], [place] [phone] Also accepts self-pay patients.  [facility] [address] Laurell Josephs [place], [place]  [phone]   [facility] [address], [place] [phone]   [facility] [address], [place] [phone]   Renaye Rakers [address], [place]   [phone] Only accepts [place] Access [place] patients after they have their name applied to their card.    Self-Pay (no insurance) in [facility]:  Organization         Address  Phone   Notes  Sickle Cell Patients, [facility] Internal Medicine [address] [place], [place] [phone]   [facility] [address] [place], [place] [phone]   [facility] [place]  [address], [place] [phone]   [facility]/Dr. Osei-Bonsu  [address] [place] or [address], [place] [phone] Phone number for both [place] and [place] locations is the same.  [facility] [address], [place] [phone]   [facility] [address] [place] or [address] [phone]   [facility] [address] [place], [place] [phone], phone; [phone], fax Sees patients 1st and 3rd Saturday of every month.  Must not qualify for public or private insurance (i.e. Medicaid, Medicare, [place] Health Choice, Veterans' Benefits)  Household income should be no more than 200% of the poverty level The clinic cannot treat you if you are pregnant or think you are pregnant  Sexually transmitted diseases are not treated at the clinic.    Dental Care: Organization         Address  Phone  Notes  [facility] [address], [place] [phone] Accepts children up to age 36 who are enrolled in [place] or [place] Health Choice; pregnant women with a Medicaid card; and children who have applied for Medicaid or [place] Health Choice, but were declined, whose parents can pay a reduced fee at time of service.  [facility] [place]  [address], [place] [phone] Accepts children up to age 36 who are enrolled in [place] or [place] Health Choice; pregnant women with a Medicaid card; and children who have applied for Medicaid or [place] Health Choice,  but were declined, whose parents can pay a reduced fee at time of service.  [facility]  [address], [place] [phone] Patients are seen by appointment only. Walk-ins are not accepted. [facility] will see patients 718 years of age and older. Monday - Tuesday (8am-5pm) Most Wednesdays (8:30-5pm) $30 per visit, cash only  [facility]  [address], [facility] [place] [phone] Patients are seen by appointment only. Walk-ins are not accepted. [facility] will see patients 36 years of age and older. One Wednesday Evening (Monthly: Volunteer Based).  $30 per visit, cash only  [employer] [facility]  [phone] for adults; Children under age 514, call Graduate Pediatric Dentistry at [phone]. Children aged 674-14, please call [phone] to request a pediatric application.  Dental services are provided in all areas of dental care including fillings, crowns and bridges, complete and partial dentures, implants, gum treatment, root canals, and extractions. Preventive care is also provided. Treatment is provided to both adults and children. Patients are selected via a lottery and there is often a waiting list.   [facility] [address], [place]  [phone] [email or web address]   [facility] [address], [place], [place] [phone], Ext. [phone] Second and Fourth Thursday of each month, opens at 6:30 AM; Clinic ends at 9 AM.  Patients are seen on a first-come first-served basis, and a limited number are seen during each clinic.   [facility]  [address], [place], [place] [phone]   Eligibility Requirements You must have lived in [place], [place], or [place] counties for at least the last three months.   You cannot be eligible for state or federal sponsored [place] Cityhealthcare insurance, including CIGNAVeterans Administration, [place]icaid, or [employer]icare.   You generally  cannot be eligible for healthcare insurance through your employer.    How to apply: Eligibility screenings are held every Tuesday and Wednesday afternoon from 1:00 pm until 4:00 pm. You do not need an appointment for the interview!  [facility] [address], [place], [place] [phone]   [facility]  [phone]   [facility]  [phone]   [facility]  [phone]    Behavioral Health Resources in the  Community: Intensive Outpatient Production manager         Address  Phone  Notes  [facility] [address], [place], [place] [phone]   [facility] Outpatient [address], [place], [place] [phone]   [facility] [address] [place], [place]  [phone]   [facility] [address],  [place], [place] [phone] or [phone]   Substance Abuse Resources Organization         Address  Phone  Notes  [facility] Drug [facility]  [phone]   [facility]  [phone]   [facility] [place]  [phone]   Floydene Flock  [phone]   Residential & Outpatient Substance Abuse Program  [phone]   Psychological Services Organization         Address  Phone  Notes  [facility]  [phone]   [facility]  [phone]   [facility] [address] [place] [phone] or [phone]    Mobile Crisis Teams Organization         Address  Phone  Notes  [facility], Mobile Crisis Care Unit  [phone]   [facility]  [address] [place], [place] [phone]   Doristine Locks [address] [place] [phone]    Self-Help/Support Groups Organization         Address  Phone             Notes  [facility] [place] - variety of support groups  336- I7437963 Call for more  information  Narcotics Anonymous (NA), [facility] [address], [employer] [place]  2 meetings at this location   Statistician         Address  Phone  Notes  [facility] [address],    [place]  [phone]   [facility]  [address], [place] [address], [place], [place] [phone]   [facility] [address] [place], [place] [phone] Admissions: 8am-3pm M-F  [facility] [address],    [place], [place] [phone]   [facility] [address] [place], [place], [place] [phone]   [facility] [address]  [place], [place] [phone]   [facility] - Intensive Outpatient [address]ure[address], [place], [place] [phone]   [facility] ([facility] [address]  [place], [place] [phone] or [phone]   [facility] ([facility]) [address] [place], [place] [phone] Accepts Medicaid  [facility] [address]  [place] [phone] Substance Abuse/Addiction Treatment   [facility] Organization         Address  Phone  Notes  [facility]  [phone]   Angie Fava, PhD [address] Ervin Knack [place], [place]   [phone] or [phone]   [facility]   [address] [place], [place] [phone]   [facility] [address], [place], [place] [phone] Insurance/Medicaid/sponsorship through [employer] and Families [address]                                    [place], [place] [phone] Therapy/tele-psych/case  [facility] [address] [place], [place] [phone]  Dr. Adele Schilder  [phone]   [facility] Dept. 1) [address], [place] 2) [place] 3)  [place], [place] [phone]  [phone]   [place] [phone] or [phone] (After Hours)

## 2014-09-04 NOTE — ED Notes (Signed)
Interpreter Service replied, they are looking for interpreter, will call when one is available.

## 2014-09-04 NOTE — ED Notes (Signed)
Patient given water for po/fluid challenge.  

## 2014-09-04 NOTE — ED Notes (Signed)
Interpreter in the room.

## 2014-09-04 NOTE — ED Notes (Signed)
Called for sign language translator  Pt is now c/o nausea in triage

## 2014-09-04 NOTE — ED Notes (Signed)
Pt is refusing to take zofran and morphine at this time, will keep order in incase she changes her mind.

## 2014-09-04 NOTE — Progress Notes (Signed)
[facility] Pt with interpreter present.  CM consult from ED RN for pcp Pt has medicare coverage confirmed in EPIC CM attempted to call pt to discuss pcps at [phone] This number [phone] is for [facility] not pt There is no other number listed for pt but her significant other and he is ? Deaf also 1136 CM left a voice message via telephone interpreting services for Karren BurlyMichael Lundgren [phone] requesting a return all to CM mobile number.   Pt with 4 ED visits in last 6 months

## 2014-09-04 NOTE — ED Notes (Signed)
Pt is deaf  Pt's friend writes pt is having back pain and chills  Sxs started 4 days ago and has been constant  Pt is pale and shaking

## 2014-09-07 LAB — URINE CULTURE: Culture: >=100000

## 2014-09-09 ENCOUNTER — Telehealth (HOSPITAL_COMMUNITY): Payer: Self-pay

## 2014-09-09 NOTE — Telephone Encounter (Signed)
Post ED Visit - Positive Culture Follow-up  Culture report reviewed by antimicrobial stewardship pharmacist: []  Wes Dulaney, Pharm.D., BCPS [x]  Celedonio MiyamotoJeremy Frens, Pharm.D., BCPS []  Georgina PillionElizabeth Martin, Pharm.D., BCPS []  [place]ady HillsMinh Pham, [address]Pharm.D., BCPS, AAHIVP []  Estella HuskMichelle Turner, Pharm.D., BCPS, AAHIVP []  Elder CyphersLorie Poole, [address] BoulevardPharm.D., BCPS  Positive Urine culture, >/= 100,000 colonies -> E Coli Treated with Cephalexin, organism sensitive to the same and no further patient follow-up is required at this time.  Arvid RightClark, Clarice Zulauf Dorn 09/09/2014, 6:22 AM

## 2014-11-15 ENCOUNTER — Emergency Department (HOSPITAL_COMMUNITY)
Admission: EM | Admit: 2014-11-15 | Discharge: 2014-11-16 | Disposition: A | Discharge status: Home / Self Care | Payer: Medicare Other | Attending: Emergency Medicine | Admitting: Emergency Medicine

## 2014-11-15 ENCOUNTER — Encounter (HOSPITAL_COMMUNITY): Payer: Self-pay | Admitting: Emergency Medicine

## 2014-11-15 DIAGNOSIS — J45909 Unspecified asthma, uncomplicated: Secondary | ICD-10-CM | POA: Diagnosis not present

## 2014-11-15 DIAGNOSIS — Y9289 Other specified places as the place of occurrence of the external cause: Secondary | ICD-10-CM | POA: Diagnosis not present

## 2014-11-15 DIAGNOSIS — Y998 Other external cause status: Secondary | ICD-10-CM | POA: Diagnosis not present

## 2014-11-15 DIAGNOSIS — Z792 Long term (current) use of antibiotics: Secondary | ICD-10-CM | POA: Insufficient documentation

## 2014-11-15 DIAGNOSIS — G8918 Other acute postprocedural pain: Secondary | ICD-10-CM | POA: Diagnosis present

## 2014-11-15 DIAGNOSIS — T40601A Poisoning by unspecified narcotics, accidental (unintentional), initial encounter: Secondary | ICD-10-CM

## 2014-11-15 DIAGNOSIS — Y9389 Activity, other specified: Secondary | ICD-10-CM | POA: Diagnosis not present

## 2014-11-15 DIAGNOSIS — T402X1A Poisoning by other opioids, accidental (unintentional), initial encounter: Secondary | ICD-10-CM | POA: Insufficient documentation

## 2014-11-15 DIAGNOSIS — H9190 Unspecified hearing loss, unspecified ear: Secondary | ICD-10-CM | POA: Insufficient documentation

## 2014-11-15 DIAGNOSIS — Z79899 Other long term (current) drug therapy: Secondary | ICD-10-CM | POA: Insufficient documentation

## 2014-11-15 HISTORY — DX: Unspecified hearing loss, unspecified ear: H91.90

## 2014-11-15 LAB — CBC WITH DIFFERENTIAL/PLATELET
BASOS PCT: 0 % (ref 0–1)
Basophils Absolute: 0 10*3/uL (ref 0.0–0.1)
Eosinophils Absolute: 0.1 10*3/uL (ref 0.0–0.7)
Eosinophils Relative: 1 % (ref 0–5)
HCT: 39.1 % (ref 36.0–46.0)
HEMOGLOBIN: 13.1 g/dL (ref 12.0–15.0)
Lymphocytes Relative: 32 % (ref 12–46)
Lymphs Abs: 3.5 10*3/uL (ref 0.7–4.0)
MCH: 30 pg (ref 26.0–34.0)
MCHC: 33.5 g/dL (ref 30.0–36.0)
MCV: 89.7 fL (ref 78.0–100.0)
MONO ABS: 0.8 10*3/uL (ref 0.1–1.0)
Monocytes Relative: 7 % (ref 3–12)
NEUTROS ABS: 6.6 10*3/uL (ref 1.7–7.7)
Neutrophils Relative %: 60 % (ref 43–77)
Platelets: 172 10*3/uL (ref 150–400)
RBC: 4.36 MIL/uL (ref 3.87–5.11)
RDW: 14 % (ref 11.5–15.5)
WBC: 10.9 10*3/uL — AB (ref 4.0–10.5)

## 2014-11-15 NOTE — ED Provider Notes (Signed)
CSN: [id number]     Arrival date & time 11/15/14  1908 History   This chart was scribed for Dione Booze, MD by Octavia Heir, ED Scribe. This patient was seen in room WA12/WA12 and the patient's care was started at 12:21 PM.    Chief Complaint  Patient presents with  . post op pain       The history is provided by the patient. A language interpreter was used Estate manager/land agent).   HPI Comments: Timothy Townsel is a 34 y.o. female has a hx of asthma and being Deaf who presents to the Emergency Department complaining of sudden onset post op pain onset yesterday. She complains of an electric shock in her right shoulder that radiates to the right side of the chest. She has been having associated chills. Pt had gall bladder surgery yesterday morning and has taken .5 of her hydrocodone that was prescribed. She notes the pain did not start until after she took the medication. Pt was worried she was allergic to the medication. Pt notes laying down makes her pain worse. Pt denies nausea and fever.  Past Medical History  Diagnosis Date  . Asthma   . Deaf    Past Surgical History  Procedure Laterality Date  . Cleft palate repair    . [place]    . Eye surgery     No family history on file. Social History  Substance Use Topics  . Smoking status: Never Smoker   . Smokeless tobacco: None  . Alcohol Use: No   OB History    No data available     Review of Systems  Gastrointestinal: Positive for abdominal pain.  All other systems reviewed and are negative.     Allergies  Review of patient's allergies indicates no known allergies.  Home Medications   Prior to Admission medications   Medication Sig Start Date End Date Taking? Authorizing Provider  albuterol (PROVENTIL HFA;VENTOLIN HFA) 108 (90 BASE) MCG/ACT inhaler Inhale 2 puffs into the lungs every 6 (six) hours as needed for wheezing or shortness of breath (wheezing and shortness of breath).     Historical Provider, MD   benzonatate (TESSALON) 100 MG capsule Take 1 capsule (100 mg total) by mouth every 8 (eight) hours. Patient not taking: Reported on 09/04/2014 07/25/14   Azalia Bilis, MD  cephALEXin (KEFLEX) 500 MG capsule Take 1 capsule (500 mg total) by mouth 4 (four) times daily. 09/04/14   Trixie Dredge, PA-C  cyclobenzaprine (FLEXERIL) 5 MG tablet Take 1 tablet (5 mg total) by mouth 3 (three) times daily as needed for muscle spasms. Patient not taking: Reported on 07/25/2014 04/19/14   Teressa Lower, NP  HYDROcodone-acetaminophen (NORCO/VICODIN) 5-325 MG per tablet Take 1-2 tablets by mouth every 4 (four) hours as needed for moderate pain or severe pain. 09/04/14   Trixie Dredge, PA-C  ibuprofen (ADVIL,MOTRIN) 200 MG tablet Take 400 mg by mouth every 4 (four) hours as needed for fever or moderate pain (back pain).     Historical Provider, MD  ibuprofen (ADVIL,MOTRIN) 600 MG tablet Take 1 tablet (600 mg total) by mouth every 6 (six) hours as needed. Patient not taking: Reported on 07/25/2014 04/30/14   Junius Finner, PA-C  ondansetron (ZOFRAN) 4 MG tablet Take 1 tablet (4 mg total) by mouth every 8 (eight) hours as needed for nausea or vomiting. 09/04/14   Trixie Dredge, PA-C  phenazopyridine (PYRIDIUM) 200 MG tablet Take 1 tablet (200 mg total) by mouth 3 (three) times daily. Patient  not taking: Reported on 07/25/2014 04/30/14   Junius Finner, PA-C  promethazine (PHENERGAN) 25 MG tablet Take 1 tablet (25 mg total) by mouth every 6 (six) hours as needed for nausea or vomiting. Patient not taking: Reported on 07/25/2014 04/30/14   Junius Finner, PA-C   Triage vitals: BP 112/79 mmHg  Pulse 82  Temp(Src) 97.8 F (36.6 C) (Oral)  Resp 18  SpO2 98% Physical Exam  Constitutional: She is oriented to person, place, and time. She appears well-developed and well-nourished. No distress.  HENT:  Head: Normocephalic and atraumatic.  Eyes: EOM are normal. Pupils are equal, round, and reactive to light.  Neck: Normal range of motion.  Neck supple. No JVD present.  Cardiovascular: Normal rate, regular rhythm and normal heart sounds.   No murmur heard. Pulmonary/Chest: Effort normal and breath sounds normal. She has no wheezes. She has no rales. She exhibits no tenderness.  Abdominal: Soft. She exhibits no distension and no mass. There is tenderness.  Abdomen dressings over 4 surgical microscopic cholecystectomy, moderate tenderness across upper abdomen  Musculoskeletal: Normal range of motion. She exhibits no edema.  Lymphadenopathy:    She has no cervical adenopathy.  Neurological: She is alert and oriented to person, place, and time. No cranial nerve deficit. She exhibits normal muscle tone. Coordination normal.  Skin: Skin is warm and dry. No rash noted.  Psychiatric: She has a normal mood and affect. Her behavior is normal. Judgment and thought content normal.  Nursing note and vitals reviewed.   ED Course  Procedures  DIAGNOSTIC STUDIES: Oxygen Saturation is 98% on RA, normal by my interpretation.  COORDINATION OF CARE:  12:27 AM Discussed treatment plan which includes pain medication shot and replace surgical bandages with pt at bedside and pt agreed to plan.  Labs Review Results for orders placed or performed during the hospital encounter of 11/15/14  Comprehensive metabolic panel  Result Value Ref Range   Sodium 138 135 - 145 mmol/L   Potassium 3.8 3.5 - 5.1 mmol/L   Chloride 105 101 - 111 mmol/L   CO2 24 22 - 32 mmol/L   Glucose, Bld 92 65 - 99 mg/dL   BUN 10 6 - 20 mg/dL   Creatinine, Ser 1.61 0.44 - 1.00 mg/dL   Calcium 9.6 8.9 - 09.6 mg/dL   Total Protein 7.7 6.5 - 8.1 g/dL   Albumin 4.2 3.5 - 5.0 g/dL   AST 40 15 - 41 U/L   ALT 29 14 - 54 U/L   Alkaline Phosphatase 64 38 - 126 U/L   Total Bilirubin 0.4 0.3 - 1.2 mg/dL   GFR calc non Af Amer >60 >60 mL/min   GFR calc Af Amer >60 >60 mL/min   Anion gap 9 5 - 15  CBC with Differential  Result Value Ref Range   WBC 10.9 (H) 4.0 - 10.5 K/uL    RBC 4.36 3.87 - 5.11 MIL/uL   Hemoglobin 13.1 12.0 - 15.0 g/dL   HCT 04.5 40.9 - 81.1 %   MCV 89.7 78.0 - 100.0 fL   MCH 30.0 26.0 - 34.0 pg   MCHC 33.5 30.0 - 36.0 g/dL   RDW 91.4 78.2 - 95.6 %   Platelets 172 150 - 400 K/uL   Neutrophils Relative % 60 43 - 77 %   Neutro Abs 6.6 1.7 - 7.7 K/uL   Lymphocytes Relative 32 12 - 46 %   Lymphs Abs 3.5 0.7 - 4.0 K/uL   Monocytes Relative 7 3 -  12 %   Monocytes Absolute 0.8 0.1 - 1.0 K/uL   Eosinophils Relative 1 0 - 5 %   Eosinophils Absolute 0.1 0.0 - 0.7 K/uL   Basophils Relative 0 0 - 1 %   Basophils Absolute 0.0 0.0 - 0.1 K/uL  Urinalysis, Routine w reflex microscopic  Result Value Ref Range   Color, Urine YELLOW YELLOW   APPearance CLOUDY (A) CLEAR   Specific Gravity, Urine 1.012 1.005 - 1.030   pH 6.0 5.0 - 8.0   Glucose, UA NEGATIVE NEGATIVE mg/dL   Hgb urine dipstick NEGATIVE NEGATIVE   Bilirubin Urine NEGATIVE NEGATIVE   Ketones, ur NEGATIVE NEGATIVE mg/dL   Protein, ur NEGATIVE NEGATIVE mg/dL   Urobilinogen, UA 0.2 0.0 - 1.0 mg/dL   Nitrite NEGATIVE NEGATIVE   Leukocytes, UA NEGATIVE NEGATIVE   I have personally reviewed and evaluated these lab results as part of my medical decision-making.  CRITICAL CARE Performed by: Dione Booze Total critical care time: 50 minutes Critical care time was exclusive of separately billable procedures and treating other patients. Critical care was necessary to treat or prevent imminent or life-threatening deterioration. Critical care was time spent personally by me on the following activities: development of treatment plan with patient and/or surrogate as well as nursing, discussions with consultants, evaluation of patient's response to treatment, examination of patient, obtaining history from patient or surrogate, ordering and performing treatments and interventions, ordering and review of laboratory studies, ordering and review of radiographic studies, pulse oximetry and re-evaluation of  patient's condition.  MDM   Final diagnoses:  Post-operative pain  Opiate or related narcotic overdose, accidental or unintentional, initial encounter    Shoulder pain which seems classic for irritation of phrenic nerve of from pneumoperitoneum related to her laparoscopic cholecystectomy. Old records have been reviewed and the procedure was done at an outpatient surgery center and there is no record of it in EPIC. Since she had not received adequate pain relief from oxycodone-acetaminophen, it was elected to give her an injection of hydromorphone. Following this, patient became apneic. When RN entered the room, there was concern that she was pulseless as well and CPR was started. However, pulse was identified and CPR was stopped. She was supported with ventilation through bag-valve-mask until IV access was able to reestablish. She was given oxycodone with prompt reversal of apnea and patient immediately woke up. Because of the difference in half-life of naloxone and hydromorphone, she was placed on anti-naloxone drip. After 4 hours on naloxone drip, it was discontinued and she was observed in the emergency department an additional 1.5 hours with no recurrence of apnea or hypoventilation. She was discharged home with instructions to take her oxycodone-acetaminophen as needed for pain. She is also advised to let any physician who prescribes parenteral narcotics for her to use low doses because of her sensitivity to respiratory depression.  I personally performed the services described in this documentation, which was scribed in my presence. The recorded information has been reviewed and is accurate.    Dione Booze, MD 11/16/14 [phone]

## 2014-11-15 NOTE — ED Notes (Signed)
Pt from home c/o pain from from gallbladder surgery yesterday. She reports she has only been taking half a pill of her hydrocodone for pain control. Pt is deaf. Incisions are covered. Small amount of dried blood noted under left upper incision dressing. No redness noted. NO fever.

## 2014-11-16 LAB — URINALYSIS, ROUTINE W REFLEX MICROSCOPIC
Bilirubin Urine: NEGATIVE
Glucose, UA: NEGATIVE mg/dL
Hgb urine dipstick: NEGATIVE
KETONES UR: NEGATIVE mg/dL
LEUKOCYTES UA: NEGATIVE
NITRITE: NEGATIVE
PROTEIN: NEGATIVE mg/dL
Specific Gravity, Urine: 1.012 (ref 1.005–1.030)
Urobilinogen, UA: 0.2 mg/dL (ref 0.0–1.0)
pH: 6 (ref 5.0–8.0)

## 2014-11-16 LAB — COMPREHENSIVE METABOLIC PANEL
ALK PHOS: 64 U/L (ref 38–126)
ALT: 29 U/L (ref 14–54)
ANION GAP: 9 (ref 5–15)
AST: 40 U/L (ref 15–41)
Albumin: 4.2 g/dL (ref 3.5–5.0)
BILIRUBIN TOTAL: 0.4 mg/dL (ref 0.3–1.2)
BUN: 10 mg/dL (ref 6–20)
CALCIUM: 9.6 mg/dL (ref 8.9–10.3)
CO2: 24 mmol/L (ref 22–32)
Chloride: 105 mmol/L (ref 101–111)
Creatinine, Ser: 0.92 mg/dL (ref 0.44–1.00)
GFR calc Af Amer: >60 mL/min (ref >60)
GLUCOSE: 92 mg/dL (ref 65–99)
Potassium: 3.8 mmol/L (ref 3.5–5.1)
Sodium: 138 mmol/L (ref 135–145)
TOTAL PROTEIN: 7.7 g/dL (ref 6.5–8.1)

## 2014-11-16 MED ORDER — ONDANSETRON HCL 4 MG/2ML IJ SOLN
INTRAMUSCULAR | Status: AC
  Administered 2014-11-16: 4 mg via INTRAVENOUS
  Filled 2014-11-16: qty 2

## 2014-11-16 MED ORDER — HYDROMORPHONE HCL 2 MG/ML IJ SOLN
2.0000 mg | Freq: Once | INTRAMUSCULAR | Status: AC
  Administered 2014-11-16: 2 mg via INTRAMUSCULAR
  Filled 2014-11-16: qty 1

## 2014-11-16 MED ORDER — ONDANSETRON HCL 4 MG/2ML IJ SOLN
4.0000 mg | Freq: Once | INTRAMUSCULAR | Status: AC
Start: 2014-11-16 — End: 2014-11-16
  Administered 2014-11-16: 4 mg via INTRAVENOUS

## 2014-11-16 MED ORDER — ONDANSETRON HCL 4 MG/2ML IJ SOLN
4.0000 mg | Freq: Once | INTRAMUSCULAR | Status: AC
  Administered 2014-11-16: 4 mg via INTRAVENOUS

## 2014-11-16 MED ORDER — NALOXONE HCL 1 MG/ML IJ SOLN
INTRAMUSCULAR | Status: AC
  Administered 2014-11-16: 02:00:00
  Filled 2014-11-16: qty 2

## 2014-11-16 MED ORDER — NALOXONE HCL 1 MG/ML IJ SOLN
1.0000 mg/h | INTRAMUSCULAR | Status: DC
  Administered 2014-11-16: 1 mg/h via INTRAVENOUS
  Filled 2014-11-16: qty 4

## 2014-11-16 MED ORDER — ONDANSETRON 8 MG PO TBDP
8.0000 mg | ORAL_TABLET | Freq: Once | ORAL | Status: AC
  Administered 2014-11-16: 8 mg via ORAL
  Filled 2014-11-16: qty 1

## 2014-11-16 MED ORDER — NALOXONE HCL 1 MG/ML IJ SOLN
2.0000 mg | Freq: Once | INTRAMUSCULAR | Status: AC
  Administered 2014-11-16: 2 mg via INTRAVENOUS

## 2014-11-16 NOTE — Code Documentation (Addendum)
Pt visibly pale, unresponsive, respiratory bagging in progress, pt hooked up to crash cart monitor pads, verble order for Narcan IV from Dr. Preston Fleeting. Radial pulses palpated.

## 2014-11-16 NOTE — Discharge Instructions (Signed)
Your pain is related to the gas that was put in your abdomen to do the surgery. As the gas is observed, the pain should get better. It is okay for you to take the oxycodone-acetaminophen which was prescribed to you by the surgeon. Feel free to return to the emergency department if you are having any problems.  While you were here, he received hydromorphone (Dilaudid). He stopped breathing because of this medication. If you ever need painkillers given by injection, make sure to let them know that she had this reaction to hydromorphone and tell your treating physician to use low doses of narcotics.

## 2014-11-16 NOTE — Code Documentation (Signed)
Narcan 1 MG administered IV per verbal order.

## 2014-11-16 NOTE — Code Documentation (Signed)
Pt is responsive post narcan admisnistrration

## 2014-11-17 ENCOUNTER — Emergency Department (HOSPITAL_COMMUNITY): Payer: Medicare Other

## 2014-11-17 ENCOUNTER — Encounter (HOSPITAL_COMMUNITY): Payer: Self-pay | Admitting: Family Medicine

## 2014-11-17 ENCOUNTER — Emergency Department (HOSPITAL_COMMUNITY)
Admission: EM | Admit: 2014-11-17 | Discharge: 2014-11-17 | Disposition: A | Discharge status: Home / Self Care | Payer: Medicare Other | Attending: Emergency Medicine | Admitting: Emergency Medicine

## 2014-11-17 DIAGNOSIS — R0789 Other chest pain: Secondary | ICD-10-CM | POA: Diagnosis not present

## 2014-11-17 DIAGNOSIS — R1011 Right upper quadrant pain: Secondary | ICD-10-CM

## 2014-11-17 DIAGNOSIS — N390 Urinary tract infection, site not specified: Secondary | ICD-10-CM | POA: Insufficient documentation

## 2014-11-17 DIAGNOSIS — J45901 Unspecified asthma with (acute) exacerbation: Secondary | ICD-10-CM | POA: Insufficient documentation

## 2014-11-17 DIAGNOSIS — R079 Chest pain, unspecified: Secondary | ICD-10-CM | POA: Diagnosis present

## 2014-11-17 DIAGNOSIS — H9190 Unspecified hearing loss, unspecified ear: Secondary | ICD-10-CM | POA: Diagnosis not present

## 2014-11-17 LAB — URINALYSIS, ROUTINE W REFLEX MICROSCOPIC
GLUCOSE, UA: NEGATIVE mg/dL
Hgb urine dipstick: NEGATIVE
Ketones, ur: 15 mg/dL — AB
NITRITE: POSITIVE — AB
PROTEIN: NEGATIVE mg/dL
Specific Gravity, Urine: 1.02 (ref 1.005–1.030)
Urobilinogen, UA: 4 mg/dL — ABNORMAL HIGH (ref 0.0–1.0)
pH: 6 (ref 5.0–8.0)

## 2014-11-17 LAB — URINE MICROSCOPIC-ADD ON

## 2014-11-17 MED ORDER — CEPHALEXIN 500 MG PO CAPS: 500.0000 mg | ORAL_CAPSULE | Freq: Four times a day (QID) | ORAL | Status: DC

## 2014-11-17 MED ORDER — CEPHALEXIN 250 MG PO CAPS
500.0000 mg | ORAL_CAPSULE | Freq: Once | ORAL | Status: AC
  Administered 2014-11-17: 500 mg via ORAL
  Filled 2014-11-17: qty 2

## 2014-11-17 NOTE — Discharge Instructions (Signed)
Take anabolic as directed for the urinary tract infection. Continue current medications. Expect the chest to be sore for at least 2 weeks and then occasionally may still have some soreness over the next several months. Return for any new or worse symptoms.  Food Choices for Gastroesophageal Reflux Disease  When you have gastroesophageal reflux disease (GERD), the foods you eat and your eating habits are very important. Choosing the right foods can help ease the discomfort of GERD.  WHAT GENERAL GUIDELINES DO I NEED TO FOLLOW?  Choose fruits, vegetables, whole grains, low-fat dairy products, and low-fat meat, fish, and poultry.  Limit fats such as oils, salad dressings, butter, nuts, and avocado.  Keep a food diary to identify foods that cause symptoms.  Avoid foods that cause reflux. These may be different for different people.  Eat frequent small meals instead of three large meals each day.  Eat your meals slowly, in a relaxed setting.  Limit fried foods.  Cook foods using methods other than frying.  Avoid drinking alcohol.  Avoid drinking large amounts of liquids with your meals.  Avoid bending over or lying down until 2-3 hours after eating. WHAT FOODS ARE NOT RECOMMENDED?  The following are some foods and drinks that may worsen your symptoms:  Vegetables  Tomatoes. Tomato juice. Tomato and spaghetti sauce. Chili peppers. Onion and garlic. Horseradish.  Fruits  Oranges, grapefruit, and lemon (fruit and juice).  Meats  High-fat meats, fish, and poultry. This includes hot dogs, ribs, ham, sausage, salami, and bacon.  Dairy  Whole milk and chocolate milk. Sour cream. Cream. Butter. Ice cream. Cream cheese.  Beverages  Coffee and tea, with or without caffeine. Carbonated beverages or energy drinks.  Condiments  Hot sauce. Barbecue sauce.  Sweets/Desserts  Chocolate and cocoa. Donuts. Peppermint and spearmint.  Fats and Oils  High-fat foods, including [place] fries and potato chips.    Other  Vinegar. Strong spices, such as black pepper, white pepper, red pepper, cayenne, curry powder, cloves, ginger, and chili powder.  The items listed above may not be a complete list of foods and beverages to avoid. Contact your dietitian for more information.  Document Released: 02/21/2005 Document Revised: 02/26/2013 Document Reviewed: 12/26/2012  [facility] Patient Information 2015 [place], [place]. This information is not intended to replace advice given to you by your health care provider. Make sure you discuss any questions you have with your health care provider.

## 2014-11-17 NOTE — ED Notes (Signed)
Pt sleeping in chair had to wake pt to get vitals.

## 2014-11-17 NOTE — ED Provider Notes (Signed)
CSN: [id number]     Arrival date & time 11/17/14  1048 History   First MD Initiated Contact with Patient 11/17/14 1556     Chief Complaint  Patient presents with  . Chest Pain     (Consider location/radiation/quality/duration/timing/severity/associated sxs/prior Treatment) Patient is a 34 y.o. female presenting with chest pain. The history is provided by the patient. The history is limited by a language barrier. A language interpreter was used.  Chest Pain Associated symptoms: abdominal pain and shortness of breath   Associated symptoms: no back pain, no fever and no headache    patient with recent gallbladder removal by Dr. Derrell Lolling on September 9. Patient seen at [facility] long emergency department on September 10 for abdominal and chest pain. Patient with extensive lab evaluation. Patient was given pain medicine had an apneic spell and had a brief period of CPR. Patient was started on Narcan and recovered patient was observed there in the emergency department for several hours and eventually released home. Most likely just had a respiratory arrest never had a true cardiac arrest. Patient's here today with complaint of anterior chest pain she thinks due to CPR. And with concern for urinary tract infection. Still has some abdominal discomfort that is mild in the right upper quadrant where she had her surgery she is not concerned about that. Patient has history of deafness and a sign interpreter was used to communicate.  Past Medical History  Diagnosis Date  . Asthma   . Deaf    Past Surgical History  Procedure Laterality Date  . Cleft palate repair    . [place]    . Eye surgery     History reviewed. No pertinent family history. Social History  Substance Use Topics  . Smoking status: Never Smoker   . Smokeless tobacco: None  . Alcohol Use: No   OB History    No data available     Review of Systems  Constitutional: Negative for fever.  HENT: Positive for hearing loss. Negative for  congestion.   Eyes: Negative for visual disturbance.  Respiratory: Positive for shortness of breath.   Cardiovascular: Positive for chest pain.  Gastrointestinal: Positive for abdominal pain.  Genitourinary: Positive for dysuria.  Musculoskeletal: Negative for back pain.  Skin: Positive for wound.  Neurological: Negative for headaches.  Hematological: Does not bruise/bleed easily.  Psychiatric/Behavioral: Negative for confusion.      Allergies  Hydromorphone  Home Medications   Prior to Admission medications   Medication Sig Start Date End Date Taking? Authorizing Provider  albuterol (PROVENTIL HFA;VENTOLIN HFA) 108 (90 BASE) MCG/ACT inhaler Inhale 2 puffs into the lungs every 6 (six) hours as needed for wheezing or shortness of breath (wheezing and shortness of breath).     Historical Provider, MD  benzonatate (TESSALON) 100 MG capsule Take 1 capsule (100 mg total) by mouth every 8 (eight) hours. Patient not taking: Reported on 09/04/2014 07/25/14   Azalia Bilis, MD  cephALEXin (KEFLEX) 500 MG capsule Take 1 capsule (500 mg total) by mouth 4 (four) times daily. Patient not taking: Reported on 11/16/2014 09/04/14   Trixie Dredge, PA-C  cephALEXin (KEFLEX) 500 MG capsule Take 1 capsule (500 mg total) by mouth 4 (four) times daily. 11/17/14   Vanetta Mulders, MD  cyclobenzaprine (FLEXERIL) 5 MG tablet Take 1 tablet (5 mg total) by mouth 3 (three) times daily as needed for muscle spasms. Patient not taking: Reported on 07/25/2014 04/19/14   Teressa Lower, NP  HYDROcodone-acetaminophen (NORCO/VICODIN) 5-325 MG per tablet  Take 1-2 tablets by mouth every 4 (four) hours as needed for moderate pain or severe pain. Patient not taking: Reported on 11/16/2014 09/04/14   Trixie Dredge, PA-C  ibuprofen (ADVIL,MOTRIN) 200 MG tablet Take 400 mg by mouth every 4 (four) hours as needed for fever or moderate pain (back pain).     Historical Provider, MD  ibuprofen (ADVIL,MOTRIN) 600 MG tablet Take 1 tablet (600  mg total) by mouth every 6 (six) hours as needed. Patient not taking: Reported on 07/25/2014 04/30/14   Junius Finner, PA-C  ondansetron (ZOFRAN) 4 MG tablet Take 1 tablet (4 mg total) by mouth every 8 (eight) hours as needed for nausea or vomiting. Patient not taking: Reported on 11/16/2014 09/04/14   Trixie Dredge, PA-C  oxyCODONE-acetaminophen (PERCOCET/ROXICET) 5-325 MG per tablet Take 0.5-1 tablets by mouth every 4 (four) hours as needed for severe pain.    Historical Provider, MD  phenazopyridine (PYRIDIUM) 200 MG tablet Take 1 tablet (200 mg total) by mouth 3 (three) times daily. Patient not taking: Reported on 07/25/2014 04/30/14   Junius Finner, PA-C  promethazine (PHENERGAN) 25 MG tablet Take 1 tablet (25 mg total) by mouth every 6 (six) hours as needed for nausea or vomiting. Patient not taking: Reported on 07/25/2014 04/30/14   Junius Finner, PA-C   BP 116/79 mmHg  Pulse 60  Temp(Src) 98.1 F (36.7 C)  Resp 20  SpO2 98% Physical Exam  Constitutional: She is oriented to person, place, and time. She appears well-developed and well-nourished. No distress.  HENT:  Head: Normocephalic and atraumatic.  Mouth/Throat: Oropharynx is clear and moist.  Eyes: Conjunctivae and EOM are normal. Pupils are equal, round, and reactive to light.  Neck: Normal range of motion.  Cardiovascular: Normal rate, regular rhythm and normal heart sounds.   No murmur heard. Pulmonary/Chest: Effort normal and breath sounds normal. No respiratory distress. She has no wheezes. She exhibits tenderness.  Reproducible tenderness to palpation to the anterior sternal part of the chest. No bruising no ecchymosis no crepitance.  Abdominal: Soft. Bowel sounds are normal. There is tenderness.  Healing of [place] Boissonneault because incisions. No evidence of any secondary infection. Mild tenderness around the incision sites no guarding.  Musculoskeletal: Normal range of motion.  Neurological: She is alert and oriented to person, place, and  time. A cranial nerve deficit is present. She exhibits normal muscle tone. Coordination normal.  Skin: Skin is warm. No rash noted.  Nursing note and vitals reviewed.   ED Course  Procedures (including critical care time) Labs Review Labs Reviewed  URINALYSIS, ROUTINE W REFLEX MICROSCOPIC (NOT AT [facility]) - Abnormal; Notable for the following:    Color, Urine AMBER (*)    APPearance CLOUDY (*)    Bilirubin Urine MODERATE (*)    Ketones, ur 15 (*)    Urobilinogen, UA 4.0 (*)    Nitrite POSITIVE (*)    Leukocytes, UA MODERATE (*)    All other components within normal limits  URINE MICROSCOPIC-ADD ON - Abnormal; Notable for the following:    Squamous Epithelial / LPF FEW (*)    Bacteria, UA MANY (*)    All other components within normal limits   Results for orders placed or performed during the hospital encounter of 11/17/14  Urinalysis, Routine w reflex microscopic (not at [facility])  Result Value Ref Range   Color, Urine AMBER (A) YELLOW   APPearance CLOUDY (A) CLEAR   Specific Gravity, Urine 1.020 1.005 - 1.030   pH 6.0 5.0 - 8.0  Glucose, UA NEGATIVE NEGATIVE mg/dL   Hgb urine dipstick NEGATIVE NEGATIVE   Bilirubin Urine MODERATE (A) NEGATIVE   Ketones, ur 15 (A) NEGATIVE mg/dL   Protein, ur NEGATIVE NEGATIVE mg/dL   Urobilinogen, UA 4.0 (H) 0.0 - 1.0 mg/dL   Nitrite POSITIVE (A) NEGATIVE   Leukocytes, UA MODERATE (A) NEGATIVE  Urine microscopic-add on  Result Value Ref Range   Squamous Epithelial / LPF FEW (A) RARE   WBC, UA 11-20 <3 WBC/hpf   Bacteria, UA MANY (A) RARE     Imaging Review Dg Chest 2 View  11/17/2014   CLINICAL DATA:  Chest pain, primarily in the sternal region  EXAM: CHEST  2 VIEW  COMPARISON:  Jul 25, 2014  FINDINGS: The lungs are clear. Heart size and pulmonary vascularity are normal. No adenopathy. No pneumothorax. No bone lesions  IMPRESSION: No abnormality noted.   Electronically Signed   By: Bretta Bang III M.D.   On: 11/17/2014 12:33   I have  personally reviewed and evaluated these images and lab results as part of my medical decision-making.   EKG Interpretation   Date/Time:  Monday November 17 2014 11:25:04 EDT Ventricular Rate:  65 PR Interval:  126 QRS Duration: 84 QT Interval:  394 QTC Calculation: 409 R Axis:   50 Text Interpretation:  Normal sinus rhythm Normal ECG Confirmed by  Carlis Burnsworth  MD, Lenise Jr ([id number]) on 11/17/2014 4:39:27 PM      MDM   Final diagnoses:  Right upper quadrant pain  UTI (lower urinary tract infection)  Chest wall pain    Patient presented today with 2 concerns. Interpreter was used. First concern was sternal chest pain following the brief period of CPR from the other night when she went apneic with pain medicine. Chest x-ray there is negative. There is no bruising no crepitance on anterior part of the chest. This probably represents rib soreness in chest wall soreness from that. No evidence of pneumonia pneumothorax or pulmonary edema.  Second concern was for possible urinary tract infection. Urinalysis is consistent with that. Patient treated with first dose of Keflex here in continue Keflex for the next 7 days. Patient will continue all her current medications and will follow-up with her surgeon as scheduled. Appears to be no, getting factors related to the gallbladder removal at this time.  Patient had extensive labs at [facility] long on September 10 no significant abnormalities.  Vanetta Mulders, MD 11/17/14 1930

## 2014-11-17 NOTE — ED Notes (Signed)
Patient left at this time with all belongings. 

## 2014-11-17 NOTE — ED Notes (Signed)
Interpretor at bedside. 

## 2014-11-17 NOTE — ED Notes (Signed)
Pt reports she thinks she has an infection, reports burning after urination and orange colored urine. Pt reports being "sore" in her chest. Pt recently had gallbladder surgery on the 9th and reports she had "cpr yesterday at [facility] long after she received a dose of dilaudid and had an allergic reaction." pt reports she went back to the hospital yesterday because she was having pain at her incision site and "electrical impulses in her chest." Pt denies fevers or vomiting. Reports some sob.  Pt alert, oriented, resp e/u. Nad.

## 2014-11-17 NOTE — ED Notes (Signed)
Pt here for chest pain and pain around her incision site from recent gall bladder surgery. Dr. Derrell Lolling is pt surgeon. sts urine is orange this am and chest pain x 3 days.

## 2014-11-17 NOTE — ED Notes (Signed)
Called for pt in lobby for room placement x 3 with no answer.  

## 2015-02-14 ENCOUNTER — Encounter (HOSPITAL_COMMUNITY): Payer: Self-pay | Admitting: *Deleted

## 2015-02-14 ENCOUNTER — Emergency Department (HOSPITAL_COMMUNITY): Payer: Medicare Other

## 2015-02-14 ENCOUNTER — Emergency Department (HOSPITAL_COMMUNITY)
Admission: EM | Admit: 2015-02-14 | Discharge: 2015-02-14 | Disposition: A | Discharge status: Home / Self Care | Payer: Medicare Other | Attending: Emergency Medicine | Admitting: Emergency Medicine

## 2015-02-14 DIAGNOSIS — Z79899 Other long term (current) drug therapy: Secondary | ICD-10-CM | POA: Insufficient documentation

## 2015-02-14 DIAGNOSIS — H9190 Unspecified hearing loss, unspecified ear: Secondary | ICD-10-CM | POA: Insufficient documentation

## 2015-02-14 DIAGNOSIS — J45901 Unspecified asthma with (acute) exacerbation: Secondary | ICD-10-CM | POA: Insufficient documentation

## 2015-02-14 DIAGNOSIS — N390 Urinary tract infection, site not specified: Secondary | ICD-10-CM

## 2015-02-14 DIAGNOSIS — Z792 Long term (current) use of antibiotics: Secondary | ICD-10-CM | POA: Diagnosis not present

## 2015-02-14 DIAGNOSIS — J069 Acute upper respiratory infection, unspecified: Secondary | ICD-10-CM | POA: Insufficient documentation

## 2015-02-14 DIAGNOSIS — J209 Acute bronchitis, unspecified: Secondary | ICD-10-CM

## 2015-02-14 DIAGNOSIS — Z3202 Encounter for pregnancy test, result negative: Secondary | ICD-10-CM | POA: Diagnosis not present

## 2015-02-14 DIAGNOSIS — R0981 Nasal congestion: Secondary | ICD-10-CM | POA: Diagnosis present

## 2015-02-14 LAB — URINE MICROSCOPIC-ADD ON: RBC / HPF: NONE SEEN RBC/hpf (ref 0–5)

## 2015-02-14 LAB — URINALYSIS, ROUTINE W REFLEX MICROSCOPIC
Bilirubin Urine: NEGATIVE
Glucose, UA: NEGATIVE mg/dL
Hgb urine dipstick: NEGATIVE
KETONES UR: NEGATIVE mg/dL
LEUKOCYTES UA: NEGATIVE
NITRITE: POSITIVE — AB
PROTEIN: NEGATIVE mg/dL
Specific Gravity, Urine: 1.01 (ref 1.005–1.030)
pH: 6.5 (ref 5.0–8.0)

## 2015-02-14 LAB — RAPID STREP SCREEN (MED CTR MEBANE ONLY): STREPTOCOCCUS, GROUP A SCREEN (DIRECT): NEGATIVE

## 2015-02-14 LAB — POC URINE PREG, ED: Preg Test, Ur: NEGATIVE

## 2015-02-14 MED ORDER — CEPHALEXIN 500 MG PO CAPS: 500.0000 mg | ORAL_CAPSULE | Freq: Two times a day (BID) | ORAL | Status: DC

## 2015-02-14 MED ORDER — CEPHALEXIN 500 MG PO CAPS
500.0000 mg | ORAL_CAPSULE | Freq: Once | ORAL | Status: AC
  Administered 2015-02-14: 500 mg via ORAL
  Filled 2015-02-14: qty 1

## 2015-02-14 MED ORDER — DEXAMETHASONE 1 MG/ML PO CONC
10.0000 mg | Freq: Once | ORAL | Status: AC
  Administered 2015-02-14: 10 mg via ORAL
  Filled 2015-02-14: qty 10

## 2015-02-14 MED ORDER — ACETAMINOPHEN 325 MG PO TABS
650.0000 mg | ORAL_TABLET | Freq: Once | ORAL | Status: AC
  Administered 2015-02-14: 650 mg via ORAL
  Filled 2015-02-14: qty 2

## 2015-02-14 MED ORDER — BENZONATATE 100 MG PO CAPS: 100.0000 mg | ORAL_CAPSULE | Freq: Three times a day (TID) | ORAL | Status: DC | PRN

## 2015-02-14 NOTE — Discharge Instructions (Signed)
Acute Bronchitis °Bronchitis is inflammation of the airways that extend from the windpipe into the lungs (bronchi). The inflammation often causes mucus to develop. This leads to a cough, which is the most common symptom of bronchitis.  °In acute bronchitis, the condition usually develops suddenly and goes away over time, usually in a couple weeks. Smoking, allergies, and asthma can make bronchitis worse. Repeated episodes of bronchitis may cause further lung problems.  °CAUSES °Acute bronchitis is most often caused by the same virus that causes a cold. The virus can spread from person to person (contagious) through coughing, sneezing, and touching contaminated objects. °SIGNS AND SYMPTOMS  °· Cough.   °· Fever.   °· Coughing up mucus.   °· Body aches.   °· Chest congestion.   °· Chills.   °· Shortness of breath.   °· Sore throat.   °DIAGNOSIS  °Acute bronchitis is usually diagnosed through a physical exam. Your health care provider will also ask you questions about your medical history. Tests, such as chest X-rays, are sometimes done to rule out other conditions.  °TREATMENT  °Acute bronchitis usually goes away in a couple weeks. Oftentimes, no medical treatment is necessary. Medicines are sometimes given for relief of fever or cough. Antibiotic medicines are usually not needed but may be prescribed in certain situations. In some cases, an inhaler may be recommended to help reduce shortness of breath and control the cough. A cool mist vaporizer may also be used to help thin bronchial secretions and make it easier to clear the chest.  °HOME CARE INSTRUCTIONS °· Get plenty of rest.   °· Drink enough fluids to keep your urine clear or pale yellow (unless you have a medical condition that requires fluid restriction). Increasing fluids may help thin your respiratory secretions (sputum) and reduce chest congestion, and it will prevent dehydration.   °· Take medicines only as directed by your health care provider. °· If  you were prescribed an antibiotic medicine, finish it all even if you start to feel better. °· Avoid smoking and secondhand smoke. Exposure to cigarette smoke or irritating chemicals will make bronchitis worse. If you are a smoker, consider using nicotine gum or skin patches to help control withdrawal symptoms. Quitting smoking will help your lungs heal faster.   °· Reduce the chances of another bout of acute bronchitis by washing your hands frequently, avoiding people with cold symptoms, and trying not to touch your hands to your mouth, nose, or eyes.   °· Keep all follow-up visits as directed by your health care provider.   °SEEK MEDICAL CARE IF: °Your symptoms do not improve after 1 week of treatment.  °SEEK IMMEDIATE MEDICAL CARE IF: °· You develop an increased fever or chills.   °· You have chest pain.   °· You have severe shortness of breath. °· You have bloody sputum.   °· You develop dehydration. °· You faint or repeatedly feel like you are going to pass out. °· You develop repeated vomiting. °· You develop a severe headache. °MAKE SURE YOU:  °· Understand these instructions. °· Will watch your condition. °· Will get help right away if you are not doing well or get worse. °  °This information is not intended to replace advice given to you by your health care provider. Make sure you discuss any questions you have with your health care provider. °  °Document Released: 03/31/2004 Document Revised: 03/14/2014 Document Reviewed: 08/14/2012 °Elsevier Interactive Patient Education ©2016 [employer] ° °Urinary Tract Infection °Urinary tract infections (UTIs) can develop anywhere along your urinary tract. Your urinary tract is   your body's drainage system for removing wastes and extra water. Your urinary tract includes two kidneys, two ureters, a bladder, and a urethra. Your kidneys are a pair of bean-shaped organs. Each kidney is about the size of your fist. They are located below your ribs, one on each side of  your spine. °CAUSES °Infections are caused by microbes, which are microscopic organisms, including fungi, viruses, and bacteria. These organisms are so small that they can only be seen through a microscope. Bacteria are the microbes that most commonly cause UTIs. °SYMPTOMS  °Symptoms of UTIs may vary by age and gender of the patient and by the location of the infection. Symptoms in young women typically include a frequent and intense urge to urinate and a painful, burning feeling in the bladder or urethra during urination. Older women and men are more likely to be tired, shaky, and weak and have muscle aches and abdominal pain. A fever may mean the infection is in your kidneys. Other symptoms of a kidney infection include pain in your back or sides below the ribs, nausea, and vomiting. °DIAGNOSIS °To diagnose a UTI, your caregiver will ask you about your symptoms. Your caregiver will also ask you to provide a urine sample. The urine sample will be tested for bacteria and white blood cells. White blood cells are made by your body to help fight infection. °TREATMENT  °Typically, UTIs can be treated with medication. Because most UTIs are caused by a bacterial infection, they usually can be treated with the use of antibiotics. The choice of antibiotic and length of treatment depend on your symptoms and the type of bacteria causing your infection. °HOME CARE INSTRUCTIONS °· If you were prescribed antibiotics, take them exactly as your caregiver instructs you. Finish the medication even if you feel better after you have only taken some of the medication. °· Drink enough water and fluids to keep your urine clear or pale yellow. °· Avoid caffeine, tea, and carbonated beverages. They tend to irritate your bladder. °· Empty your bladder often. Avoid holding urine for long periods of time. °· Empty your bladder before and after sexual intercourse. °· After a bowel movement, women should cleanse from front to back. Use each  tissue only once. °SEEK MEDICAL CARE IF:  °· You have back pain. °· You develop a fever. °· Your symptoms do not begin to resolve within 3 days. °SEEK IMMEDIATE MEDICAL CARE IF:  °· You have severe back pain or lower abdominal pain. °· You develop chills. °· You have nausea or vomiting. °· You have continued burning or discomfort with urination. °MAKE SURE YOU:  °· Understand these instructions. °· Will watch your condition. °· Will get help right away if you are not doing well or get worse. °  °This information is not intended to replace advice given to you by your health care provider. Make sure you discuss any questions you have with your health care provider. °  °Document Released: 12/01/2004 Document Revised: 11/12/2014 Document Reviewed: 04/01/2011 °Elsevier Interactive Patient Education ©2016 [employer] ° °

## 2015-02-14 NOTE — ED Notes (Addendum)
Per EMS, pt complains of sinus congestion, shortness of breath for the past week. Pt states her chest hurts when coughing. Pt has hx of asthma. Pt is deaf, sign language interpreter has been called. Pt states she has 7/10 pain in her throat, nose and face.

## 2015-02-14 NOTE — ED Provider Notes (Signed)
CSN: [id number]     Arrival date & time 02/14/15  1237 History   First MD Initiated Contact with Patient 02/14/15 1300     Chief Complaint  Patient presents with  . Nasal Congestion  . Shortness of Breath  . Cough     Patient is a 34 y.o. female presenting with shortness of breath and cough. The history is provided by the patient. No language interpreter was used.  Shortness of Breath Associated symptoms: cough   Cough Associated symptoms: shortness of breath    Kyra SearlesJennifer Papp is a 34 y.o. female who presents to the Emergency Department complaining of cough, sore throat.   History is limited as patient is staff and sign interpreter is not available. History obtained through writing and gestures. She reports 1 week of cough, posttussive emesis, chills, sore throat. She has chest pain with coughing. She denies any abdominal pain, dysuria. Symptoms are moderate, constant, worsening. She is taking ibuprofen without any significant relief.  Past Medical History  Diagnosis Date  . Asthma   . Deaf    Past Surgical History  Procedure Laterality Date  . Cleft palate repair    . [place]    . Eye surgery     No family history on file. Social History  Substance Use Topics  . Smoking status: Never Smoker   . Smokeless tobacco: None  . Alcohol Use: No   OB History    No data available     Review of Systems  Respiratory: Positive for cough and shortness of breath.   All other systems reviewed and are negative.     Allergies  Hydromorphone  Home Medications   Prior to Admission medications   Medication Sig Start Date End Date Taking? Authorizing Provider  albuterol (PROVENTIL HFA;VENTOLIN HFA) 108 (90 BASE) MCG/ACT inhaler Inhale 2 puffs into the lungs every 6 (six) hours as needed for wheezing or shortness of breath (wheezing and shortness of breath).     Historical Provider, MD  benzonatate (TESSALON) 100 MG capsule Take 1 capsule (100 mg total) by mouth every 8 (eight)  hours. Patient not taking: Reported on 09/04/2014 07/25/14   Azalia BilisKevin Campos, MD  cephALEXin (KEFLEX) 500 MG capsule Take 1 capsule (500 mg total) by mouth 4 (four) times daily. Patient not taking: Reported on 11/16/2014 09/04/14   Trixie DredgeEmily West, PA-C  cephALEXin (KEFLEX) 500 MG capsule Take 1 capsule (500 mg total) by mouth 4 (four) times daily. 11/17/14   Vanetta MuldersScott Zackowski, MD  cyclobenzaprine (FLEXERIL) 5 MG tablet Take 1 tablet (5 mg total) by mouth 3 (three) times daily as needed for muscle spasms. Patient not taking: Reported on 07/25/2014 04/19/14   Teressa LowerVrinda Pickering, NP  HYDROcodone-acetaminophen (NORCO/VICODIN) 5-325 MG per tablet Take 1-2 tablets by mouth every 4 (four) hours as needed for moderate pain or severe pain. Patient not taking: Reported on 11/16/2014 09/04/14   Trixie DredgeEmily West, PA-C  ibuprofen (ADVIL,MOTRIN) 200 MG tablet Take 400 mg by mouth every 4 (four) hours as needed for fever or moderate pain (back pain).     Historical Provider, MD  ibuprofen (ADVIL,MOTRIN) 600 MG tablet Take 1 tablet (600 mg total) by mouth every 6 (six) hours as needed. Patient not taking: Reported on 07/25/2014 04/30/14   Junius FinnerErin O'Malley, PA-C  ondansetron (ZOFRAN) 4 MG tablet Take 1 tablet (4 mg total) by mouth every 8 (eight) hours as needed for nausea or vomiting. Patient not taking: Reported on 11/16/2014 09/04/14   Trixie DredgeEmily West, PA-C  oxyCODONE-acetaminophen (PERCOCET/ROXICET)  5-325 MG per tablet Take 0.5-1 tablets by mouth every 4 (four) hours as needed for severe pain.    Historical Provider, MD  phenazopyridine (PYRIDIUM) 200 MG tablet Take 1 tablet (200 mg total) by mouth 3 (three) times daily. Patient not taking: Reported on 07/25/2014 04/30/14   Junius Finner, PA-C  promethazine (PHENERGAN) 25 MG tablet Take 1 tablet (25 mg total) by mouth every 6 (six) hours as needed for nausea or vomiting. Patient not taking: Reported on 07/25/2014 04/30/14   Junius Finner, PA-C   BP 117/81 mmHg  Pulse 75  Temp(Src) 98.4 F (36.9 C)  (Oral)  Resp 18  SpO2 100% Physical Exam  Constitutional: She is oriented to person, place, and time. She appears well-developed and well-nourished.  HENT:  Head: Normocephalic and atraumatic.  Right Ear: External ear normal.  Left Ear: External ear normal.   Moderate tonsillar erythema, edema and exudates bilaterally.  Cardiovascular: Normal rate and regular rhythm.   No murmur heard. Pulmonary/Chest: Effort normal and breath sounds normal. No respiratory distress.   No stridor  Abdominal: Soft. There is no tenderness. There is no rebound and no guarding.  Musculoskeletal: She exhibits no edema or tenderness.  Neurological: She is alert and oriented to person, place, and time.  Skin: Skin is warm and dry.  Psychiatric: She has a normal mood and affect. Her behavior is normal.  Nursing note and vitals reviewed.   ED Course  Procedures (including critical care time) Labs Review Labs Reviewed  URINALYSIS, ROUTINE W REFLEX MICROSCOPIC (NOT AT [facility]) - Abnormal; Notable for the following:    APPearance HAZY (*)    Nitrite POSITIVE (*)    All other components within normal limits  URINE MICROSCOPIC-ADD ON - Abnormal; Notable for the following:    Squamous Epithelial / LPF 0-5 (*)    Bacteria, UA MANY (*)    All other components within normal limits  RAPID STREP SCREEN (NOT AT [facility])  CULTURE, GROUP A STREP  POC URINE PREG, ED    Imaging Review Dg Chest 2 View  02/14/2015  CLINICAL DATA:  34 year old presenting with asthma exacerbation. One week history of shortness of breath. One day history of cough and chest pain associated with coughing. EXAM: CHEST  2 VIEW COMPARISON:  11/17/2014 and previously. FINDINGS: AP upright and lateral imaging was performed. Cardiomediastinal silhouette unremarkable, unchanged. Moderate central peribronchial thickening and mildly prominent bronchovascular markings diffusely, more so than on the most recent previous exam. Lungs otherwise clear. No  localized airspace consolidation. No pleural effusions. No pneumothorax. Normal pulmonary vascularity. Visualized bony thorax intact. IMPRESSION: Moderate changes of acute bronchitis and/or asthma without focal airspace pneumonia. Electronically Signed   By: Hulan Saas M.D.   On: 02/14/2015 14:52   I have personally reviewed and evaluated these images and lab results as part of my medical decision-making.   EKG Interpretation None      MDM   Final diagnoses:  Acute UTI  Acute bronchitis, unspecified organism     Patient here for evaluation of bodyaches cough, sore throat. Patient is nontoxic appearing on examination with the respiratory distress. Lung exam is clear. Urine is concerning for UTI. Treated with 1 time dose of Decadron  For pharyngitis. Discussed with patient and care for bronchitis, UTI , treatment with Keflex, Tessalon Perles for cough, outpatient follow-up, return precautions.    Tilden Fossa, MD 02/14/15 [id number]

## 2015-02-14 NOTE — ED Notes (Signed)
MD at bedside. Dr. Rees at bedside.  

## 2015-02-14 NOTE — ED Notes (Signed)
Bed: [id number] Expected date: 02/14/15 Expected time: 12:39 PM Means of arrival: Ambulance Comments: 34 yo F Deaf pt, congestion

## 2015-02-16 LAB — CULTURE, GROUP A STREP: Strep A Culture: NEGATIVE

## 2015-06-29 ENCOUNTER — Emergency Department (HOSPITAL_COMMUNITY): Payer: Medicare Other

## 2015-06-29 ENCOUNTER — Emergency Department (HOSPITAL_COMMUNITY)
Admission: EM | Admit: 2015-06-29 | Discharge: 2015-06-30 | Disposition: A | Discharge status: Home / Self Care | Payer: Medicare Other | Attending: Emergency Medicine | Admitting: Emergency Medicine

## 2015-06-29 ENCOUNTER — Encounter (HOSPITAL_COMMUNITY): Payer: Self-pay | Admitting: Emergency Medicine

## 2015-06-29 DIAGNOSIS — J45901 Unspecified asthma with (acute) exacerbation: Secondary | ICD-10-CM | POA: Diagnosis not present

## 2015-06-29 DIAGNOSIS — R42 Dizziness and giddiness: Secondary | ICD-10-CM | POA: Diagnosis not present

## 2015-06-29 DIAGNOSIS — Z79899 Other long term (current) drug therapy: Secondary | ICD-10-CM | POA: Insufficient documentation

## 2015-06-29 DIAGNOSIS — H9190 Unspecified hearing loss, unspecified ear: Secondary | ICD-10-CM | POA: Diagnosis not present

## 2015-06-29 DIAGNOSIS — R079 Chest pain, unspecified: Secondary | ICD-10-CM | POA: Insufficient documentation

## 2015-06-29 LAB — BASIC METABOLIC PANEL
Anion gap: 5 (ref 5–15)
BUN: 13 mg/dL (ref 6–20)
CALCIUM: 10 mg/dL (ref 8.9–10.3)
CO2: 26 mmol/L (ref 22–32)
CREATININE: 0.68 mg/dL (ref 0.44–1.00)
Chloride: 107 mmol/L (ref 101–111)
GFR calc non Af Amer: >60 mL/min (ref >60)
GLUCOSE: 115 mg/dL — AB (ref 65–99)
Potassium: 4 mmol/L (ref 3.5–5.1)
Sodium: 138 mmol/L (ref 135–145)

## 2015-06-29 LAB — CBC
HEMATOCRIT: 40.7 % (ref 36.0–46.0)
Hemoglobin: 13.8 g/dL (ref 12.0–15.0)
MCH: 29.9 pg (ref 26.0–34.0)
MCHC: 33.9 g/dL (ref 30.0–36.0)
MCV: 88.3 fL (ref 78.0–100.0)
PLATELETS: 172 10*3/uL (ref 150–400)
RBC: 4.61 MIL/uL (ref 3.87–5.11)
RDW: 13.4 % (ref 11.5–15.5)
WBC: 9.4 10*3/uL (ref 4.0–10.5)

## 2015-06-29 LAB — I-STAT TROPONIN, ED: Troponin i, poc: 0 ng/mL (ref 0.00–0.08)

## 2015-06-29 NOTE — ED Notes (Signed)
Pt states she has been having chest pain for a few days  Pt states it is a cramping type pain and it radiates down her right arm  Pt states she is having some shortness of breath with the pain  Pt states pain is constant

## 2015-06-30 LAB — I-STAT TROPONIN, ED: TROPONIN I, POC: 0 ng/mL (ref 0.00–0.08)

## 2015-06-30 MED ORDER — OMEPRAZOLE 20 MG PO CPDR: DELAYED_RELEASE_CAPSULE | ORAL | Status: AC

## 2015-06-30 MED ORDER — PANTOPRAZOLE SODIUM 40 MG PO TBEC
40.0000 mg | DELAYED_RELEASE_TABLET | Freq: Every day | ORAL | Status: DC
  Administered 2015-06-30: 40 mg via ORAL
  Filled 2015-06-30: qty 1

## 2015-06-30 NOTE — Discharge Instructions (Signed)
Please read and follow all provided instructions.  Your diagnoses today include:  1. Chest pain, unspecified chest pain type     Tests performed today include:  An EKG of your heart - normal  A chest x-ray  Cardiac enzymes - a blood test for heart muscle damage  Blood counts and electrolytes  Vital signs. See below for your results today.   Medications prescribed:   Omeprazole (Prilosec) - stomach acid reducer  This medication can be found over-the-counter  Take any prescribed medications only as directed.  Follow-up instructions: Please follow-up with your primary care provider as soon as you can for further evaluation of your symptoms.   Return instructions:  SEEK IMMEDIATE MEDICAL ATTENTION IF:  You have severe chest pain, especially if the pain is crushing or pressure-like and spreads to the arms, back, neck, or jaw, or if you have sweating, nausea (feeling sick to your stomach), or shortness of breath. THIS IS AN EMERGENCY. Don't wait to see if the pain will go away. Get medical help at once. Call 911 or 0 (operator). DO NOT drive yourself to the hospital.   Your chest pain gets worse and does not go away with rest.   You have an attack of chest pain lasting longer than usual, despite rest and treatment with the medications your caregiver has prescribed.   You wake from sleep with chest pain or shortness of breath.  You feel dizzy or faint.  You have chest pain not typical of your usual pain for which you originally saw your caregiver.   You have any other emergent concerns regarding your health.  Additional Information: Chest pain comes from many different causes. Your caregiver has diagnosed you as having chest pain that is not specific for one problem, but does not require admission.  You are at low risk for an acute heart condition or other serious illness.   Your vital signs today were: BP 101/74 mmHg   Pulse 69   Temp(Src) 98 F (36.7 C) (Oral)   Resp 14    SpO2 98% If your blood pressure (BP) was elevated above 135/85 this visit, please have this repeated by your doctor within one month. -------------- [employer] 211 is a great source of information about community services available.  Access by dialing 2-1-1 from anywhere in [place], or by website -  PooledIncome.pl.   Other Local Resources (Updated 03/2015)  Financial Assistance   Services    Phone Number and Address  [facility]  Low-cost medical care - 1st and 3rd Saturday of every month  Must not qualify for public or private insurance and must have limited income [phone] [address] [place], [place]    Ali Molina [employer] of Social Services  Child care  Emergency assistance for housing and [employer]  Medicaid [phone] [address] [place], [place] [address]   [facility]  Low-cost medical care for children, communicable diseases, sexually-transmitted diseases, immunizations, maternity care, womens health and family planning [phone] [address] [place], [place] [address]  [facility]   Medication assistance for [facility] residents  Must meet income requirements [phone] [address] [place], [place].    [facility] Social Services  Child care  Emergency assistance for housing and [employer]  Medicaid [phone] [address] [place], [place] [address]  [facility] and [facility]   Low-cost medical care,  Monday through Friday, 9 am to 6 pm.   Accepts Medicare/Medicaid, and self-pay [phone] [address] [place], [place] [address]  [facility] for Children  Low-cost medical care - Monday through Friday, 8:30 am - 5:30 pm  Accepts Medicaid and self-pay [phone] [address], [address]  [place], [place] [address]   [facility]  Primary medical care, including for those with sickle cell disease  Accepts Medicare, Medicaid, insurance and self-pay [phone] [address] [place], [place]  [facility]   Primary medical care  Accepts Medicare, [place], insurance and self-pay [phone] [address] [place], [place] [address]   [facility] Department of Social Services  Child care  Emergency assistance for housing and [employer]  Medicaid [id number] [address] [place], [place] [address]  [facility] Department of Health and [employer]  Child care  Emergency assistance for housing and [employer]  Medicaid [phone] [address] [place], [place] [address]   [facility] Medication Assistance Program  Medication assistance for [facility] residents with no insurance only  Must have a primary care doctor [phone] [address] [place], [place]  [facility]   Primary medical care  Seth Ward, [place], insurance  [phone] [address] [place], [place]  [facility]   Medication assistance [phone]  [facility]   Primary medical care  Accepts Medicare, [place], insurance and self-pay [phone] [address] [place], [place] [address]  [facility]   Primary medical care  Accepts Medicare, [place], insurance and self-pay [phone] [address] [place], [place] [address]  [facility]  For [place] residents between the ages of 87 and 47 who do not have any form of health insurance, Medicare, [place], or [place] benefits.  Services are provided free of charge to uninsured patients who fall within federal poverty guidelines.    Hours: Tuesdays and Thursdays, 4:15 - 8 pm [phone] [address] [place], [place] [address]  [facility]     Primary medical care  Dental care  Nutritional counseling  Pharmacy  Accepts Medicaid, Medicare, most insurance.  Fees are adjusted based on ability to pay.   [phone] [facility] [address] [place], [place]  [phone] [facility] [address] [place], [place]  [phone] [facility] [place], [place]  [phone] [employer], [address] [place], [place]  [phone] [facility] [address] [place], [place]  [facility]  Womens health and family planning [phone] [address] [place], [place]  [facility] Department of Social Services  Child care  Emergency assistance for housing and [employer]  Medicaid [phone] [address] [place], [place] [address]   [facility]    Ages 31 and older  Hours: Mondays and Thursdays, 7:00 am - 9:00 am Patients are seen on a first come, first served basis. [phone], ext. [phone] [place], [place]  [facility] Division of Social Services  Child care  Emergency assistance for housing and [employer]  Medicaid [phone] [address] [place], [place] [address]  [facility]  Medication assistance  Rental assistance  Food pantry  Medication assistance  Housing assistance  Emergency food distribution  Utility assistance [phone] [address]  [place], [place]  [phone]  [address] [place], [place] [phone] Hours: Tuesdays and Thursdays from 9am - 12 noon by appointment only  [phone] [address] [place], [place] [id number]  [facility] Lanae Boastlara [facility]. [facility]   Accepts private insurance, [place], and [place].  Payment is based on a sliding scale for those without  insurance.  Hours: Mondays, Tuesdays and Thursdays, 8:30 am - 5:30 pm.   [phone] [address] , [place]  [facility] - Family Medicine at [facility]    Accepts private insurance, [place], and [place].  Payment is based on a sliding scale for those without insurance. [phone] [address] [place], [place]  [facility] - Pediatrics at [address] Scientist, research (physical sciences)[place]  Accepts private insurance, [employer]icare, and [place].  Payment is based on a sliding scale for those without insurance [phone] [address], [place], [place]  [facility] - [facility] at [facility]  Accepts private insurance, [place], and [place].  Payment is based on a sliding scale for those without insurance. [phone] [address] [place], [place]  [facility] - Pediatrics at [facility]  Accepts private insurance, [place], and [place].  Payment is based on a sliding scale for those without insurance. [phone], [phone]. [phone] [place], [place].    [facility]  Maternity care.  Accepts Medicaid and self-pay. [phone] [address] [place], [place]

## 2015-06-30 NOTE — ED Provider Notes (Signed)
CSN: [id number]     Arrival date & time 06/29/15  2138 History   First MD Initiated Contact with Patient 06/30/15 0014     Chief Complaint  Patient presents with  . Chest Pain     (Consider location/radiation/quality/duration/timing/severity/associated sxs/prior Treatment) HPI Comments: Patient with history of asthma presents with several days of on and off mid chest pain with radiation to her right arm. Pain will last for 3 or 4 hours at a time. She describes it as cramping. It can happen at any time, at rest or with activity. She has some lightheadedness and shortness of breath when the pain occurs. No history of hypertension, high cholesterol, diabetes, smoking. Father died of heart problems in his 10s. Patient denies risk factors for pulmonary embolism including: unilateral leg swelling, history of DVT/PE/other blood clots, use of exogenous hormones, recent immobilizations, recent surgery, recent travel (>4hr segment), malignancy, hemoptysis. No fever, cough. Patient took ibuprofen without relief. The onset of this condition was acute. The course is constant. Aggravating factors: none. Alleviating factors: none.     Patient is a 35 y.o. female presenting with chest pain. The history is provided by the patient and medical records.  Chest Pain Associated symptoms: no abdominal pain, no back pain, no cough, no diaphoresis, no fever, no nausea, no palpitations, no shortness of breath and not vomiting     Past Medical History  Diagnosis Date  . Asthma   . Deaf    Past Surgical History  Procedure Laterality Date  . Cleft palate repair    . [place]    . Eye surgery    . Cholecystectomy     Family History  Problem Relation Age of Onset  . Heart attack Father    Social History  Substance Use Topics  . Smoking status: Never Smoker   . Smokeless tobacco: None  . Alcohol Use: No   OB History    No data available     Review of Systems  Constitutional: Negative for fever and  diaphoresis.  Eyes: Negative for redness.  Respiratory: Negative for cough and shortness of breath.   Cardiovascular: Positive for chest pain. Negative for palpitations and leg swelling.  Gastrointestinal: Negative for nausea, vomiting and abdominal pain.  Genitourinary: Negative for dysuria.  Musculoskeletal: Negative for back pain and neck pain.  Skin: Negative for rash.  Neurological: Positive for light-headedness. Negative for syncope.  Psychiatric/Behavioral: The patient is not nervous/anxious.     Allergies  Hydromorphone  Home Medications   Prior to Admission medications   Medication Sig Start Date End Date Taking? Authorizing Provider  albuterol (PROVENTIL HFA;VENTOLIN HFA) 108 (90 BASE) MCG/ACT inhaler Inhale 2 puffs into the lungs every 6 (six) hours as needed for wheezing or shortness of breath (wheezing and shortness of breath).    Yes Historical Provider, MD  ibuprofen (ADVIL,MOTRIN) 200 MG tablet Take 400 mg by mouth every 4 (four) hours as needed for fever or moderate pain (back pain).    Yes Historical Provider, MD  benzonatate (TESSALON) 100 MG capsule Take 1 capsule (100 mg total) by mouth 3 (three) times daily as needed for cough. Patient not taking: Reported on 06/29/2015 02/14/15   Tilden Fossa, MD  cephALEXin (KEFLEX) 500 MG capsule Take 1 capsule (500 mg total) by mouth 2 (two) times daily. Patient not taking: Reported on 06/29/2015 02/14/15   Tilden Fossa, MD   BP 121/88 mmHg  Pulse 76  Temp(Src) 98 F (36.7 C) (Oral)  Resp 9  SpO2  100%   Physical Exam  Constitutional: She appears well-developed and well-nourished.  HENT:  Head: Normocephalic and atraumatic.  Mouth/Throat: Oropharynx is clear and moist and mucous membranes are normal. Mucous membranes are not dry.  Eyes: Conjunctivae are normal.  Neck: Trachea normal and normal range of motion. Neck supple. Normal carotid pulses and no JVD present. No muscular tenderness present. Carotid bruit is not  present. No tracheal deviation present.  Cardiovascular: Normal rate, regular rhythm, S1 normal, S2 normal, normal heart sounds and intact distal pulses.  Exam reveals no decreased pulses.   No murmur heard. Pulmonary/Chest: Effort normal. No respiratory distress. She has no wheezes. She exhibits no tenderness.  Abdominal: Soft. Normal aorta and bowel sounds are normal. There is no tenderness. There is no rebound and no guarding.  Musculoskeletal: Normal range of motion. She exhibits no edema or tenderness.  Lower extremity exam without edema or redness.   Neurological: She is alert.  Skin: Skin is warm and dry. She is not diaphoretic. No cyanosis. No pallor.  Psychiatric: She has a normal mood and affect.  Nursing note and vitals reviewed.   ED Course  Procedures (including critical care time) Labs Review Labs Reviewed  BASIC METABOLIC PANEL - Abnormal; Notable for the following:    Glucose, Bld 115 (*)    All other components within normal limits  CBC  I-STAT TROPOININ, ED  Rosezena SensorI-STAT TROPOININ, ED    Imaging Review Dg Chest 2 View  06/29/2015  CLINICAL DATA:  Patient with chest pain and shortness of breath. EXAM: CHEST  2 VIEW COMPARISON:  Chest radiograph 02/14/2015 FINDINGS: Normal cardiac and mediastinal contours. No consolidative pulmonary opacities. No pleural effusion or pneumothorax. Regional skeleton is unremarkable. IMPRESSION: No active cardiopulmonary disease. Electronically Signed   By: Annia Beltrew  Davis M.D.   On: 06/29/2015 22:31   I have personally reviewed and evaluated these images and lab results as part of my medical decision-making.  ED ECG REPORT   Date: 06/30/2015  Rate: 75  Rhythm: normal sinus rhythm  QRS Axis: normal  Intervals: normal  ST/T Wave abnormalities: normal  Conduction Disutrbances:none  Narrative Interpretation:   Old EKG Reviewed: unchanged  I have personally reviewed the EKG tracing and agree with the computerized printout as noted.   1:15  AM Patient seen and examined. Work-up reviewed. PERC neg.   Vital signs reviewed and are as follows: BP 121/88 mmHg  Pulse 76  Temp(Src) 98 F (36.7 C) (Oral)  Resp 9  SpO2 100%  2:49 AM Delta troponin negative. Patient informed of all results. Encouraged PCP follow-up in next week for recheck, referrals given. Will give trial of omeprazole. Questions answered.  Patient was counseled to return with severe chest pain, especially if the pain is crushing or pressure-like and spreads to the arms, back, neck, or jaw, or if they have sweating, nausea, or shortness of breath with the pain. They were encouraged to call 911 with these symptoms.   They were also told to return if their chest pain gets worse and does not go away with rest, they have an attack of chest pain lasting longer than usual despite rest and treatment with the medications their caregiver has prescribed, if they wake from sleep with chest pain or shortness of breath, if they feel dizzy or faint, if they have chest pain not typical of their usual pain, or if they have any other emergent concerns regarding their health.  The patient verbalized understanding and agreed.    MDM  Final diagnoses:  Chest pain, unspecified chest pain type   Patient with chest tightness, intermittent for several days. Feel patient is low risk for ACS given history (poor story for ACS/MI), negative troponin(s), normal/unchanged EKG. Minimal risk factors. CXR is clear. Pt is PERC neg. Doubt life-threatening cause of chest pain tonight. Symptoms currently resolved and patient appears comfortable.      Renne Crigler, PA-C 06/30/15 0251  Laurence Spates, MD 07/03/15 1020

## 2015-07-16 ENCOUNTER — Emergency Department (HOSPITAL_COMMUNITY)
Admission: EM | Admit: 2015-07-16 | Discharge: 2015-07-16 | Disposition: A | Discharge status: Home / Self Care | Payer: Medicare Other | Attending: Emergency Medicine | Admitting: Emergency Medicine

## 2015-07-16 ENCOUNTER — Encounter (HOSPITAL_COMMUNITY): Payer: Self-pay | Admitting: *Deleted

## 2015-07-16 ENCOUNTER — Emergency Department (HOSPITAL_COMMUNITY): Payer: Medicare Other

## 2015-07-16 DIAGNOSIS — J45909 Unspecified asthma, uncomplicated: Secondary | ICD-10-CM | POA: Diagnosis not present

## 2015-07-16 DIAGNOSIS — Z7951 Long term (current) use of inhaled steroids: Secondary | ICD-10-CM | POA: Diagnosis not present

## 2015-07-16 DIAGNOSIS — R509 Fever, unspecified: Secondary | ICD-10-CM | POA: Diagnosis present

## 2015-07-16 DIAGNOSIS — B349 Viral infection, unspecified: Secondary | ICD-10-CM

## 2015-07-16 DIAGNOSIS — R0602 Shortness of breath: Secondary | ICD-10-CM | POA: Diagnosis not present

## 2015-07-16 DIAGNOSIS — Z79899 Other long term (current) drug therapy: Secondary | ICD-10-CM | POA: Insufficient documentation

## 2015-07-16 DIAGNOSIS — Z791 Long term (current) use of non-steroidal anti-inflammatories (NSAID): Secondary | ICD-10-CM | POA: Diagnosis not present

## 2015-07-16 DIAGNOSIS — R52 Pain, unspecified: Secondary | ICD-10-CM | POA: Diagnosis not present

## 2015-07-16 LAB — COMPREHENSIVE METABOLIC PANEL
ALBUMIN: 4.6 g/dL (ref 3.5–5.0)
ALT: 16 U/L (ref 14–54)
AST: 24 U/L (ref 15–41)
Alkaline Phosphatase: 79 U/L (ref 38–126)
Anion gap: 8 (ref 5–15)
BUN: 13 mg/dL (ref 6–20)
CHLORIDE: 102 mmol/L (ref 101–111)
CO2: 27 mmol/L (ref 22–32)
Calcium: 9.7 mg/dL (ref 8.9–10.3)
Creatinine, Ser: 0.86 mg/dL (ref 0.44–1.00)
GFR calc Af Amer: >60 mL/min (ref >60)
GLUCOSE: 103 mg/dL — AB (ref 65–99)
POTASSIUM: 4.1 mmol/L (ref 3.5–5.1)
Sodium: 137 mmol/L (ref 135–145)
Total Bilirubin: 0.5 mg/dL (ref 0.3–1.2)
Total Protein: 8 g/dL (ref 6.5–8.1)

## 2015-07-16 LAB — URINALYSIS, ROUTINE W REFLEX MICROSCOPIC
BILIRUBIN URINE: NEGATIVE
Glucose, UA: NEGATIVE mg/dL
Hgb urine dipstick: NEGATIVE
KETONES UR: NEGATIVE mg/dL
LEUKOCYTES UA: NEGATIVE
Nitrite: NEGATIVE
Protein, ur: NEGATIVE mg/dL
Specific Gravity, Urine: 1.015 (ref 1.005–1.030)
pH: 6.5 (ref 5.0–8.0)

## 2015-07-16 LAB — CBC WITH DIFFERENTIAL/PLATELET
BASOS ABS: 0 10*3/uL (ref 0.0–0.1)
BASOS PCT: 0 %
EOS PCT: 5 %
Eosinophils Absolute: 0.4 10*3/uL (ref 0.0–0.7)
HCT: 39.6 % (ref 36.0–46.0)
Hemoglobin: 13.7 g/dL (ref 12.0–15.0)
Lymphocytes Relative: 14 %
Lymphs Abs: 1.2 10*3/uL (ref 0.7–4.0)
MCH: 29.9 pg (ref 26.0–34.0)
MCHC: 34.6 g/dL (ref 30.0–36.0)
MCV: 86.5 fL (ref 78.0–100.0)
MONO ABS: 0.5 10*3/uL (ref 0.1–1.0)
Monocytes Relative: 6 %
Neutro Abs: 6.5 10*3/uL (ref 1.7–7.7)
Neutrophils Relative %: 75 %
PLATELETS: 131 10*3/uL — AB (ref 150–400)
RBC: 4.58 MIL/uL (ref 3.87–5.11)
RDW: 13.3 % (ref 11.5–15.5)
WBC: 8.6 10*3/uL (ref 4.0–10.5)

## 2015-07-16 LAB — I-STAT CG4 LACTIC ACID, ED: LACTIC ACID, VENOUS: 0.97 mmol/L (ref 0.5–2.0)

## 2015-07-16 LAB — I-STAT BETA HCG BLOOD, ED (MC, WL, AP ONLY)

## 2015-07-16 MED ORDER — ONDANSETRON 8 MG PO TBDP: 8.0000 mg | ORAL_TABLET | Freq: Three times a day (TID) | ORAL | Status: DC | PRN

## 2015-07-16 MED ORDER — BENZONATATE 100 MG PO CAPS: 100.0000 mg | ORAL_CAPSULE | Freq: Three times a day (TID) | ORAL | Status: AC

## 2015-07-16 MED ORDER — SODIUM CHLORIDE 0.9 % IV SOLN
INTRAVENOUS | Status: DC
  Administered 2015-07-16: 18:00:00 via INTRAVENOUS

## 2015-07-16 MED ORDER — KETOROLAC TROMETHAMINE 30 MG/ML IJ SOLN
30.0000 mg | Freq: Once | INTRAMUSCULAR | Status: AC
  Administered 2015-07-16: 30 mg via INTRAVENOUS
  Filled 2015-07-16: qty 1

## 2015-07-16 MED ORDER — SODIUM CHLORIDE 0.9 % IV BOLUS (SEPSIS)
1000.0000 mL | Freq: Once | INTRAVENOUS | Status: AC
  Administered 2015-07-16: 1000 mL via INTRAVENOUS

## 2015-07-16 MED ORDER — ACETAMINOPHEN 325 MG PO TABS
650.0000 mg | ORAL_TABLET | Freq: Once | ORAL | Status: AC
  Administered 2015-07-16: 650 mg via ORAL
  Filled 2015-07-16: qty 2

## 2015-07-16 NOTE — Discharge Instructions (Signed)

## 2015-07-16 NOTE — ED Provider Notes (Signed)
CSN: [id number]     Arrival date & time 07/16/15  1505 History   First MD Initiated Contact with Patient 07/16/15 1556     Chief Complaint  Patient presents with  . hearing impaired   . Fever    started today  . Generalized Body Aches     (Consider location/radiation/quality/duration/timing/severity/associated sxs/prior Treatment) HPI Comments: Patient here complaining of cough congestion 4 days with associated dyspnea. No vomiting or diarrhea. Complains of pleuritic chest pain. Denies any neck pain photophobia but has had some generalized myalgias as well as headache. Denies any rashes. Seen here 2 weeks ago for chest pain and had negative evaluation at that time. Has been using over-the-counter medications at home. Nothing makes her symptoms better.  Patient is a 35 y.o. female presenting with fever. The history is provided by the patient and a friend. A language interpreter was used.  Fever   Past Medical History  Diagnosis Date  . Asthma   . Deaf    Past Surgical History  Procedure Laterality Date  . Cleft palate repair    . [place]    . Eye surgery    . Cholecystectomy     Family History  Problem Relation Age of Onset  . Heart attack Father    Social History  Substance Use Topics  . Smoking status: Never Smoker   . Smokeless tobacco: Not on file  . Alcohol Use: No   OB History    No data available     Review of Systems  Constitutional: Positive for fever.  All other systems reviewed and are negative.     Allergies  Hydromorphone  Home Medications   Prior to Admission medications   Medication Sig Start Date End Date Taking? Authorizing Provider  albuterol (PROVENTIL HFA;VENTOLIN HFA) 108 (90 BASE) MCG/ACT inhaler Inhale 2 puffs into the lungs every 6 (six) hours as needed for wheezing or shortness of breath (wheezing and shortness of breath).     Historical Provider, MD  ibuprofen (ADVIL,MOTRIN) 200 MG tablet Take 400 mg by mouth every 4 (four) hours as  needed for fever or moderate pain (back pain).     Historical Provider, MD  omeprazole (PRILOSEC) 20 MG capsule Take one capsule PO twice a day for 3 days, then one capsule PO once a day 06/30/15   Renne CriglerJoshua Geiple, PA-C   BP 86/68 mmHg  Pulse 110  Temp(Src) 100.9 F (38.3 C) (Oral)  Resp 18  SpO2 100% Physical Exam  Constitutional: She is oriented to person, place, and time. She appears well-developed and well-nourished.  Non-toxic appearance. No distress.  HENT:  Head: Normocephalic and atraumatic.  Eyes: Conjunctivae, EOM and lids are normal. Pupils are equal, round, and reactive to light.  Neck: Normal range of motion. Neck supple. No tracheal deviation present. No thyroid mass present.  Cardiovascular: Regular rhythm and normal heart sounds.  Tachycardia present.  Exam reveals no gallop.   No murmur heard. Pulmonary/Chest: Effort normal and breath sounds normal. No stridor. No respiratory distress. She has no decreased breath sounds. She has no wheezes. She has no rhonchi. She has no rales.  Abdominal: Soft. Normal appearance and bowel sounds are normal. She exhibits no distension. There is no tenderness. There is no rebound and no CVA tenderness.  Musculoskeletal: Normal range of motion. She exhibits no edema or tenderness.  Neurological: She is alert and oriented to person, place, and time. She has normal strength. No cranial nerve deficit or sensory deficit. GCS eye subscore  is 4. GCS verbal subscore is 5. GCS motor subscore is 6.  Skin: Skin is warm and dry. No abrasion and no rash noted.  Psychiatric: Her speech is normal and behavior is normal. Her affect is blunt.  Nursing note and vitals reviewed.   ED Course  Procedures (including critical care time) Labs Review Labs Reviewed  CULTURE, BLOOD (ROUTINE X 2)  CULTURE, BLOOD (ROUTINE X 2)  URINE CULTURE  CBC WITH DIFFERENTIAL/PLATELET  COMPREHENSIVE METABOLIC PANEL  URINALYSIS, ROUTINE W REFLEX MICROSCOPIC (NOT AT [facility])   I-STAT CG4 LACTIC ACID, ED  I-STAT BETA HCG BLOOD, ED ([facility], [facility], [facility] ONLY)    Imaging Review No results found. I have personally reviewed and evaluated these images and lab results as part of my medical decision-making.   EKG Interpretation None      MDM   Final diagnoses:  SOB (shortness of breath)    Patient given IV fluids and medication for emesis here. Patient's fever treated with Tylenol as well as Toradol. Does not appear to be septic at this time. She feels better at this time. Suspect viral illness and patient stable for discharge    Lorre Nick, MD 07/16/15 2117

## 2015-07-16 NOTE — ED Notes (Signed)
Patient is alert and oriented x4.  She is complaining of fever with generalized body aches and shortness of breath.   Patient adds that she has had a fever but can not recall what it was.  Currently she rates her pain 10 of 10.

## 2015-07-16 NOTE — ED Notes (Addendum)
Pt is hearing impaired. Fever of 102 today. Generalized body aches. [facility] also c/o chest pain as pt is being transferred to room

## 2015-07-16 NOTE — ED Notes (Signed)
Pt is refusing to be stuck for the second set of cultures.  I have signed and written everything down on paper for her ( pt is hearing impaired).  She is refusing.  RN and Dr Freida BusmanAllen aware.

## 2015-07-18 LAB — URINE CULTURE

## 2015-07-21 LAB — CULTURE, BLOOD (ROUTINE X 2)
CULTURE: NO GROWTH
Culture: NO GROWTH

## 2015-09-30 ENCOUNTER — Emergency Department (HOSPITAL_COMMUNITY)
Admission: EM | Admit: 2015-09-30 | Discharge: 2015-09-30 | Disposition: A | Discharge status: Home / Self Care | Payer: Medicare Other | Attending: Emergency Medicine | Admitting: Emergency Medicine

## 2015-09-30 ENCOUNTER — Encounter (HOSPITAL_COMMUNITY): Payer: Self-pay

## 2015-09-30 DIAGNOSIS — Z791 Long term (current) use of non-steroidal anti-inflammatories (NSAID): Secondary | ICD-10-CM | POA: Insufficient documentation

## 2015-09-30 DIAGNOSIS — J45901 Unspecified asthma with (acute) exacerbation: Secondary | ICD-10-CM | POA: Insufficient documentation

## 2015-09-30 DIAGNOSIS — Z79899 Other long term (current) drug therapy: Secondary | ICD-10-CM | POA: Diagnosis not present

## 2015-09-30 DIAGNOSIS — R0602 Shortness of breath: Secondary | ICD-10-CM | POA: Diagnosis present

## 2015-09-30 MED ORDER — ALBUTEROL SULFATE (2.5 MG/3ML) 0.083% IN NEBU
5.0000 mg | INHALATION_SOLUTION | Freq: Once | RESPIRATORY_TRACT | Status: AC
  Administered 2015-09-30: 5 mg via RESPIRATORY_TRACT
  Filled 2015-09-30: qty 6

## 2015-09-30 MED ORDER — ALBUTEROL SULFATE HFA 108 (90 BASE) MCG/ACT IN AERS
2.0000 | INHALATION_SPRAY | RESPIRATORY_TRACT | Status: DC | PRN
  Filled 2015-09-30: qty 6.7

## 2015-09-30 MED ORDER — ALBUTEROL SULFATE HFA 108 (90 BASE) MCG/ACT IN AERS: 2.0000 | INHALATION_SPRAY | Freq: Four times a day (QID) | RESPIRATORY_TRACT | 2 refills | Status: AC | PRN

## 2015-09-30 NOTE — ED Provider Notes (Signed)
[facility]-EMERGENCY DEPT Provider Note   CSN: [id number] Arrival date & time: 09/30/15  5830  First Provider Contact:  First MD Initiated Contact with Patient 09/30/15 [phone]        History   Chief Complaint Chief Complaint  Patient presents with  . Asthma    HPI Brandy Freeman is a 35 y.o. female.  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  She's been out of her inhaler for approximately the last month.  She does not have a local PCP.  With the help of a sign language interpreter, it is determined that she has never been hospitalized for her asthma.      Past Medical History:  Diagnosis Date  . Asthma   . Deaf     There are no active problems to display for this patient.   Past Surgical History:  Procedure Laterality Date  . CHOLECYSTECTOMY    . CLEFT PALATE REPAIR    . eustac    . EYE SURGERY      OB History    No data available       Home Medications    Prior to Admission medications   Medication Sig Start Date End Date Taking? Authorizing Provider  ibuprofen (ADVIL,MOTRIN) 200 MG tablet Take 400 mg by mouth every 4 (four) hours as needed for fever or moderate pain (back pain).    Yes Historical Provider, MD  albuterol (PROVENTIL HFA;VENTOLIN HFA) 108 (90 Base) MCG/ACT inhaler Inhale 2 puffs into the lungs every 6 (six) hours as needed for wheezing or shortness of breath. 09/30/15   Earley Favor, NP  benzonatate (TESSALON) 100 MG capsule Take 1 capsule (100 mg total) by mouth every 8 (eight) hours. Patient not taking: Reported on 09/30/2015 07/16/15   Lorre Nick, MD  omeprazole (PRILOSEC) 20 MG capsule Take one capsule PO twice a day for 3 days, then one capsule PO once a day Patient not taking: Reported on 09/30/2015 06/30/15   Renne Crigler, PA-C  ondansetron (ZOFRAN ODT) 8 MG disintegrating tablet Take 1 tablet (8 mg total) by mouth every 8 (eight) hours as needed for  nausea or vomiting. Patient not taking: Reported on 09/30/2015 07/16/15   Lorre Nick, MD    Family History Family History  Problem Relation Age of Onset  . Heart attack Father     Social History Social History  Substance Use Topics  . Smoking status: Never Smoker  . Smokeless tobacco: Never Used  . Alcohol use No     Allergies   Hydromorphone   Review of Systems Review of Systems  Respiratory: Positive for shortness of breath and wheezing.   Gastrointestinal: Negative for nausea and vomiting.  All other systems reviewed and are negative.    Physical Exam Updated Vital Signs BP 103/67 (BP Location: Left Arm)   Pulse 102   Temp 98 F (36.7 C) (Oral)   Resp 18   SpO2 99%   Physical Exam  Constitutional: She appears well-developed and well-nourished.  HENT:  Head: Normocephalic.  Eyes: Pupils are equal, round, and reactive to light.  Neck: Normal range of motion.  Cardiovascular: Normal rate.   Pulmonary/Chest: Effort normal. No respiratory distress. She has wheezes.  I examined the patient after she received an albuterol treatment at triage.  She still has some very coarse expiratory wheeze on the left base.  She is in no respiratory distress at this  time.  She'll be monitored.  She will receive another albuterol treatment approximately 45 minutes  Nursing note and vitals reviewed.    ED Treatments / Results  Labs (all labs ordered are listed, but only abnormal results are displayed) Labs Reviewed - No data to display  EKG  EKG Interpretation None       Radiology No results found.  Procedures Procedures (including critical care time)  Medications Ordered in ED Medications  albuterol (PROVENTIL HFA;VENTOLIN HFA) 108 (90 Base) MCG/ACT inhaler 2 puff (not administered)  albuterol (PROVENTIL) (2.5 MG/3ML) 0.083% nebulizer solution 5 mg (5 mg Nebulization Given 09/30/15 0138)  albuterol (PROVENTIL) (2.5 MG/3ML) 0.083% nebulizer solution 5 mg (5 mg  Nebulization Given 09/30/15 0407)     Initial Impression / Assessment and Plan / ED Course  I have reviewed the triage vital signs and the nursing notes.  Pertinent labs & imaging results that were available during my care of the patient were reviewed by me and considered in my medical decision making (see chart for details).  Clinical Course    She was given 2 albuterol treatments in the emergency department with total resolution of her wheezing.  She has been provided with an inhaler to go home with.  She is requesting referrals to local primary care physicians for regular medical care   Final Clinical Impressions(s) / ED Diagnoses   Final diagnoses:  Asthma exacerbation    New Prescriptions New Prescriptions   ALBUTEROL (PROVENTIL HFA;VENTOLIN HFA) 108 (90 BASE) MCG/ACT INHALER    Inhale 2 puffs into the lungs every 6 (six) hours as needed for wheezing or shortness of breath.     Earley Favor, NP 09/30/15 0355    Earley Favor, NP 09/30/15 2956    Earley Favor, NP 09/30/15 2130    Dione Booze, MD 09/30/15 [phone]

## 2015-09-30 NOTE — ED Notes (Signed)
Pt ambulatory and independent at discharge.  Used sign language interpreter to explain discharge instructions.  Pt stated she understood all instructions and how to use her inhaler.

## 2015-09-30 NOTE — ED Notes (Signed)
Visitor at desk requesting information, pt states she is feeling better

## 2015-09-30 NOTE — Discharge Instructions (Signed)
You have been given a referral to [facility], please call and make an appointment for evaluation and treatment and to establish care.  You also can make an appointment with [facility]

## 2015-09-30 NOTE — ED Triage Notes (Signed)
Pt complains of an asthma attack that started today, inhaler isn't helping

## 2015-09-30 NOTE — ED Notes (Signed)
Pt states she needs a new inhaler because she ran out at home.

## 2015-09-30 NOTE — ED Notes (Signed)
RT called to administer albuterol tx.

## 2015-10-31 ENCOUNTER — Emergency Department (HOSPITAL_COMMUNITY): Payer: Medicare Other

## 2015-10-31 ENCOUNTER — Emergency Department (HOSPITAL_COMMUNITY)
Admission: EM | Admit: 2015-10-31 | Discharge: 2015-10-31 | Disposition: A | Discharge status: Home / Self Care | Payer: Medicare Other | Attending: Emergency Medicine | Admitting: Emergency Medicine

## 2015-10-31 ENCOUNTER — Encounter (HOSPITAL_COMMUNITY): Payer: Self-pay | Admitting: *Deleted

## 2015-10-31 DIAGNOSIS — J45909 Unspecified asthma, uncomplicated: Secondary | ICD-10-CM | POA: Insufficient documentation

## 2015-10-31 DIAGNOSIS — Z79899 Other long term (current) drug therapy: Secondary | ICD-10-CM | POA: Diagnosis not present

## 2015-10-31 DIAGNOSIS — R5381 Other malaise: Secondary | ICD-10-CM | POA: Diagnosis present

## 2015-10-31 DIAGNOSIS — N390 Urinary tract infection, site not specified: Secondary | ICD-10-CM | POA: Insufficient documentation

## 2015-10-31 LAB — URINE MICROSCOPIC-ADD ON

## 2015-10-31 LAB — URINALYSIS, ROUTINE W REFLEX MICROSCOPIC
BILIRUBIN URINE: NEGATIVE
GLUCOSE, UA: NEGATIVE mg/dL
Ketones, ur: NEGATIVE mg/dL
Nitrite: POSITIVE — AB
Protein, ur: NEGATIVE mg/dL
SPECIFIC GRAVITY, URINE: 1.021 (ref 1.005–1.030)
pH: 5.5 (ref 5.0–8.0)

## 2015-10-31 LAB — POC URINE PREG, ED: Preg Test, Ur: NEGATIVE

## 2015-10-31 LAB — COMPREHENSIVE METABOLIC PANEL
ALBUMIN: 3.6 g/dL (ref 3.5–5.0)
ALK PHOS: 87 U/L (ref 38–126)
ALT: 15 U/L (ref 14–54)
AST: 29 U/L (ref 15–41)
Anion gap: 6 (ref 5–15)
BUN: 16 mg/dL (ref 6–20)
CALCIUM: 9.9 mg/dL (ref 8.9–10.3)
CHLORIDE: 105 mmol/L (ref 101–111)
CO2: 26 mmol/L (ref 22–32)
CREATININE: 1.09 mg/dL — AB (ref 0.44–1.00)
GFR calc non Af Amer: >60 mL/min (ref >60)
GLUCOSE: 102 mg/dL — AB (ref 65–99)
Potassium: 4.5 mmol/L (ref 3.5–5.1)
SODIUM: 137 mmol/L (ref 135–145)
Total Bilirubin: 1 mg/dL (ref 0.3–1.2)
Total Protein: 7.3 g/dL (ref 6.5–8.1)

## 2015-10-31 LAB — CBC
HCT: 39 % (ref 36.0–46.0)
HEMOGLOBIN: 12.6 g/dL (ref 12.0–15.0)
MCH: 28.9 pg (ref 26.0–34.0)
MCHC: 32.3 g/dL (ref 30.0–36.0)
MCV: 89.4 fL (ref 78.0–100.0)
PLATELETS: 182 10*3/uL (ref 150–400)
RBC: 4.36 MIL/uL (ref 3.87–5.11)
RDW: 14.1 % (ref 11.5–15.5)
WBC: 9.3 10*3/uL (ref 4.0–10.5)

## 2015-10-31 LAB — LIPASE, BLOOD: LIPASE: 31 U/L (ref 11–51)

## 2015-10-31 MED ORDER — ONDANSETRON 8 MG PO TBDP: 8.0000 mg | ORAL_TABLET | Freq: Three times a day (TID) | ORAL | 0 refills | Status: AC | PRN

## 2015-10-31 MED ORDER — DEXTROSE 5 % IV SOLN
1.0000 g | Freq: Once | INTRAVENOUS | Status: AC
  Administered 2015-10-31: 1 g via INTRAVENOUS
  Filled 2015-10-31: qty 10

## 2015-10-31 MED ORDER — ONDANSETRON 4 MG PO TBDP
ORAL_TABLET | ORAL | Status: AC
  Filled 2015-10-31: qty 1

## 2015-10-31 MED ORDER — CEPHALEXIN 500 MG PO CAPS: 500.0000 mg | ORAL_CAPSULE | Freq: Four times a day (QID) | ORAL | 0 refills | Status: AC

## 2015-10-31 MED ORDER — NAPROXEN 375 MG PO TABS: 375.0000 mg | ORAL_TABLET | Freq: Two times a day (BID) | ORAL | 0 refills | Status: AC

## 2015-10-31 MED ORDER — ONDANSETRON 4 MG PO TBDP
4.0000 mg | ORAL_TABLET | Freq: Once | ORAL | Status: AC | PRN
  Administered 2015-10-31: 4 mg via ORAL

## 2015-10-31 NOTE — ED Provider Notes (Signed)
[facility]-EMERGENCY DEPT Provider Note   CSN: [id number] Arrival date & time: 10/31/15  1820     History   Chief Complaint Chief Complaint  Patient presents with  . URI  . Generalized Body Aches  . Nausea    HPI Brandy Freeman is a 35 y.o. female.  HPI Pt has been having trouble with cough, malaise for a few weeks now.  She saw her doctor but those medications did not seem to help.  She was diagnosed with a viral illness.  The symptoms got worse the last couple of days.  She continues to cough.  She has chills and feels cold.  She has myalgias.  No vomiting or diarrhea.  No dysuria.   No foreign travel.  No tick bites.  No rashes.    Past Medical History:  Diagnosis Date  . Asthma   . Deaf     There are no active problems to display for this patient.   Past Surgical History:  Procedure Laterality Date  . CHOLECYSTECTOMY    . CLEFT PALATE REPAIR    . eustac    . EYE SURGERY      OB History    No data available       Home Medications    Prior to Admission medications   Medication Sig Start Date End Date Taking? Authorizing Provider  albuterol (PROVENTIL HFA;VENTOLIN HFA) 108 (90 Base) MCG/ACT inhaler Inhale 2 puffs into the lungs every 6 (six) hours as needed for wheezing or shortness of breath. 09/30/15   Earley Favor, NP  benzonatate (TESSALON) 100 MG capsule Take 1 capsule (100 mg total) by mouth every 8 (eight) hours. Patient not taking: Reported on 09/30/2015 07/16/15   Lorre Nick, MD  cephALEXin (KEFLEX) 500 MG capsule Take 1 capsule (500 mg total) by mouth 4 (four) times daily. 10/31/15   Linwood Dibbles, MD  ibuprofen (ADVIL,MOTRIN) 200 MG tablet Take 400 mg by mouth every 4 (four) hours as needed for fever or moderate pain (back pain).     Historical Provider, MD  naproxen (NAPROSYN) 375 MG tablet Take 1 tablet (375 mg total) by mouth 2 (two) times daily. 10/31/15   Linwood Dibbles, MD  omeprazole (PRILOSEC) 20 MG capsule Take one capsule PO twice a day for 3 days,  then one capsule PO once a day Patient not taking: Reported on 09/30/2015 06/30/15   Renne Crigler, PA-C  ondansetron (ZOFRAN ODT) 8 MG disintegrating tablet Take 1 tablet (8 mg total) by mouth every 8 (eight) hours as needed for nausea or vomiting. 10/31/15   Linwood Dibbles, MD    Family History Family History  Problem Relation Age of Onset  . Heart attack Father     Social History Social History  Substance Use Topics  . Smoking status: Never Smoker  . Smokeless tobacco: Never Used  . Alcohol use No     Allergies   Hydromorphone   Review of Systems Review of Systems   Physical Exam Updated Vital Signs BP 115/91   Pulse 80   Temp 100.4 F (38 C) (Oral)   Resp 20   Ht 5' (1.524 m)   Wt 74 kg   SpO2 99%   BMI 31.86 kg/m   Physical Exam  Constitutional: She appears well-developed and well-nourished. No distress.  HENT:  Head: Normocephalic and atraumatic.  Right Ear: Tympanic membrane and external ear normal.  Left Ear: Tympanic membrane and external ear normal.  Mouth/Throat: No oropharyngeal exudate.  Eyes: Conjunctivae are normal.  Right eye exhibits no discharge. Left eye exhibits no discharge. No scleral icterus.  Neck: Neck supple. No tracheal deviation present.  Cardiovascular: Normal rate, regular rhythm and intact distal pulses.   Pulmonary/Chest: Effort normal and breath sounds normal. No stridor. No respiratory distress. She has no wheezes. She has no rales.  Abdominal: Soft. Bowel sounds are normal. She exhibits no distension. There is no tenderness. There is no rebound and no guarding.  No CVA tenderness  Musculoskeletal: She exhibits no edema or tenderness.  Neurological: She is alert. She has normal strength. No cranial nerve deficit (no facial droop, extraocular movements intact, no slurred speech) or sensory deficit. She exhibits normal muscle tone. She displays no seizure activity. Coordination normal.  Skin: Skin is warm and dry. No rash noted.    Psychiatric: She has a normal mood and affect.  Nursing note and vitals reviewed.    ED Treatments / Results  Labs (all labs ordered are listed, but only abnormal results are displayed) Labs Reviewed  COMPREHENSIVE METABOLIC PANEL - Abnormal; Notable for the following:       Result Value   Glucose, Bld 102 (*)    Creatinine, Ser 1.09 (*)    All other components within normal limits  URINALYSIS, ROUTINE W REFLEX MICROSCOPIC (NOT AT [facility]) - Abnormal; Notable for the following:    APPearance CLOUDY (*)    Hgb urine dipstick TRACE (*)    Nitrite POSITIVE (*)    Leukocytes, UA MODERATE (*)    All other components within normal limits  URINE MICROSCOPIC-ADD ON - Abnormal; Notable for the following:    Squamous Epithelial / LPF 6-30 (*)    Bacteria, UA MANY (*)    All other components within normal limits  LIPASE, BLOOD  CBC  POC URINE PREG, ED    EKG  EKG Interpretation None       Radiology Dg Chest 2 View  Result Date: 10/31/2015 CLINICAL DATA:  Cold symptoms. EXAM: CHEST  2 VIEW COMPARISON:  07/16/2015 FINDINGS: The heart size and mediastinal contours are within normal limits. Both lungs are clear. The visualized skeletal structures are unremarkable. IMPRESSION: No active cardiopulmonary disease. Electronically Signed   By: Ted Mcalpine M.D.   On: 10/31/2015 21:23    Procedures Procedures (including critical care time)  Medications Ordered in ED Medications  ondansetron (ZOFRAN-ODT) 4 MG disintegrating tablet (not administered)  cefTRIAXone (ROCEPHIN) 1 g in dextrose 5 % 50 mL IVPB (1 g Intravenous New Bag/Given 10/31/15 2131)  ondansetron (ZOFRAN-ODT) disintegrating tablet 4 mg (4 mg Oral Given 10/31/15 1913)     Initial Impression / Assessment and Plan / ED Course  I have reviewed the triage vital signs and the nursing notes.  Pertinent labs & imaging results that were available during my care of the patient were reviewed by me and considered in my medical  decision making (see chart for details).  Clinical Course    Pt has been having constitutional sx associated with her UTI.  Possible early pyelo.   IV dose of rocephin given in the ED.  Will dc home with oral abx, antinausea medications.  Follow up with PCP  Final Clinical Impressions(s) / ED Diagnoses   Final diagnoses:  UTI (lower urinary tract infection)    New Prescriptions New Prescriptions   CEPHALEXIN (KEFLEX) 500 MG CAPSULE    Take 1 capsule (500 mg total) by mouth 4 (four) times daily.   NAPROXEN (NAPROSYN) 375 MG TABLET    Take 1 tablet (  375 mg total) by mouth 2 (two) times daily.     Linwood DibblesJon Jennice Renegar, MD 10/31/15 2158

## 2015-10-31 NOTE — ED Triage Notes (Addendum)
Pt c/o cold symptoms, n/v, chills, fever, general body aches for three weeks. Pt reports vomiting everyday. Pt states she took 800 mgibuprofen two hours ago.

## 2015-10-31 NOTE — ED Notes (Signed)
Discharged explained to patient by physician using sign language interpreter with stratus.  Instructions understood.  To discharge home.  Reports feeling better.

## 2015-10-31 NOTE — Discharge Instructions (Signed)
Follow up with a primary care doctor to make sure the urine infection resolves, take the antibiotics as prescribed, return for worsening symptoms

## 2015-11-04 LAB — URINE CULTURE: Culture: >=100000 — AB

## 2016-12-06 IMAGING — CR DG CHEST 2V
2 series · 2 of 2 positions shown · non-contrast
Comparison: 07/16/2015

CLINICAL DATA: Cold symptoms.

EXAM:
CHEST  2 VIEW

[chest pa]
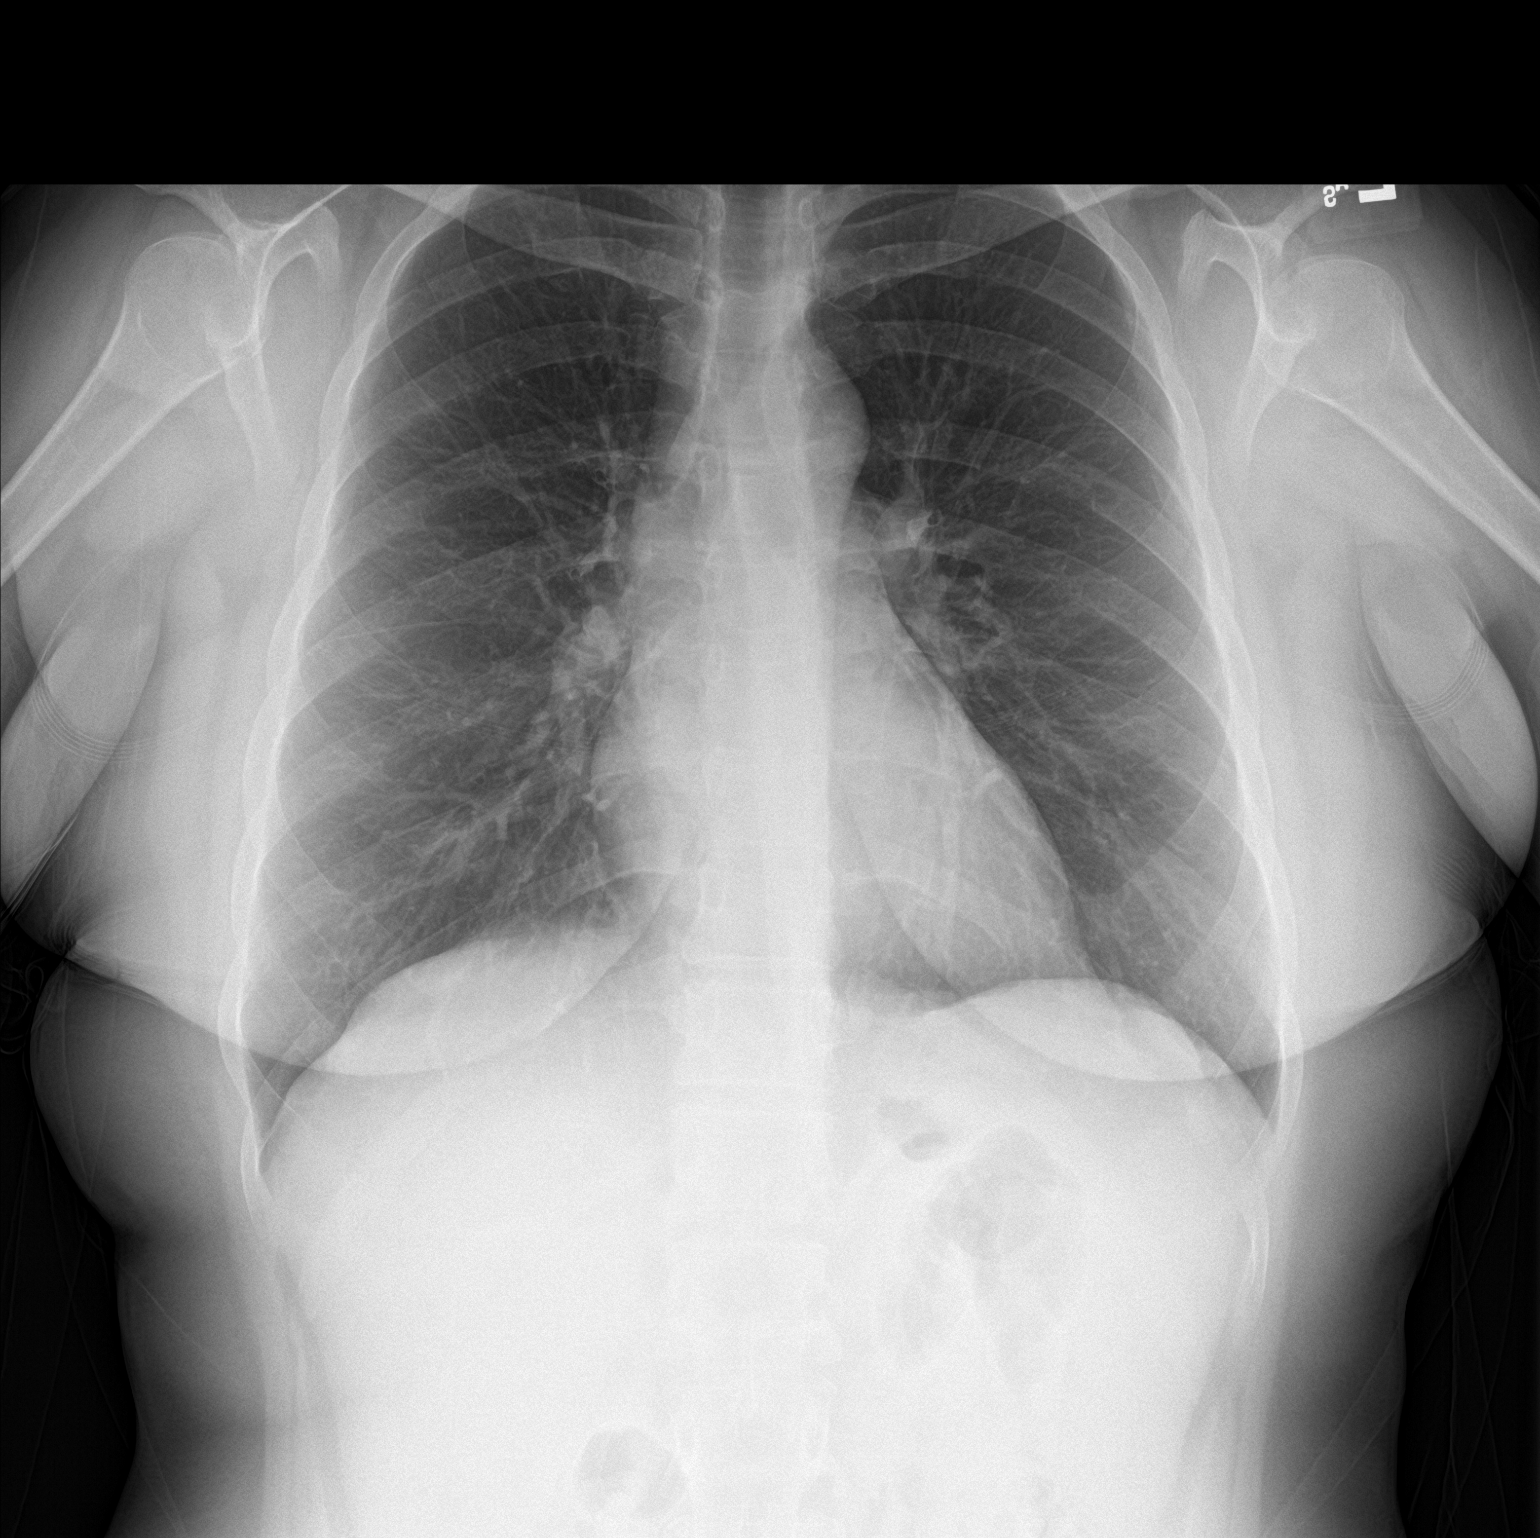

[chest lat]
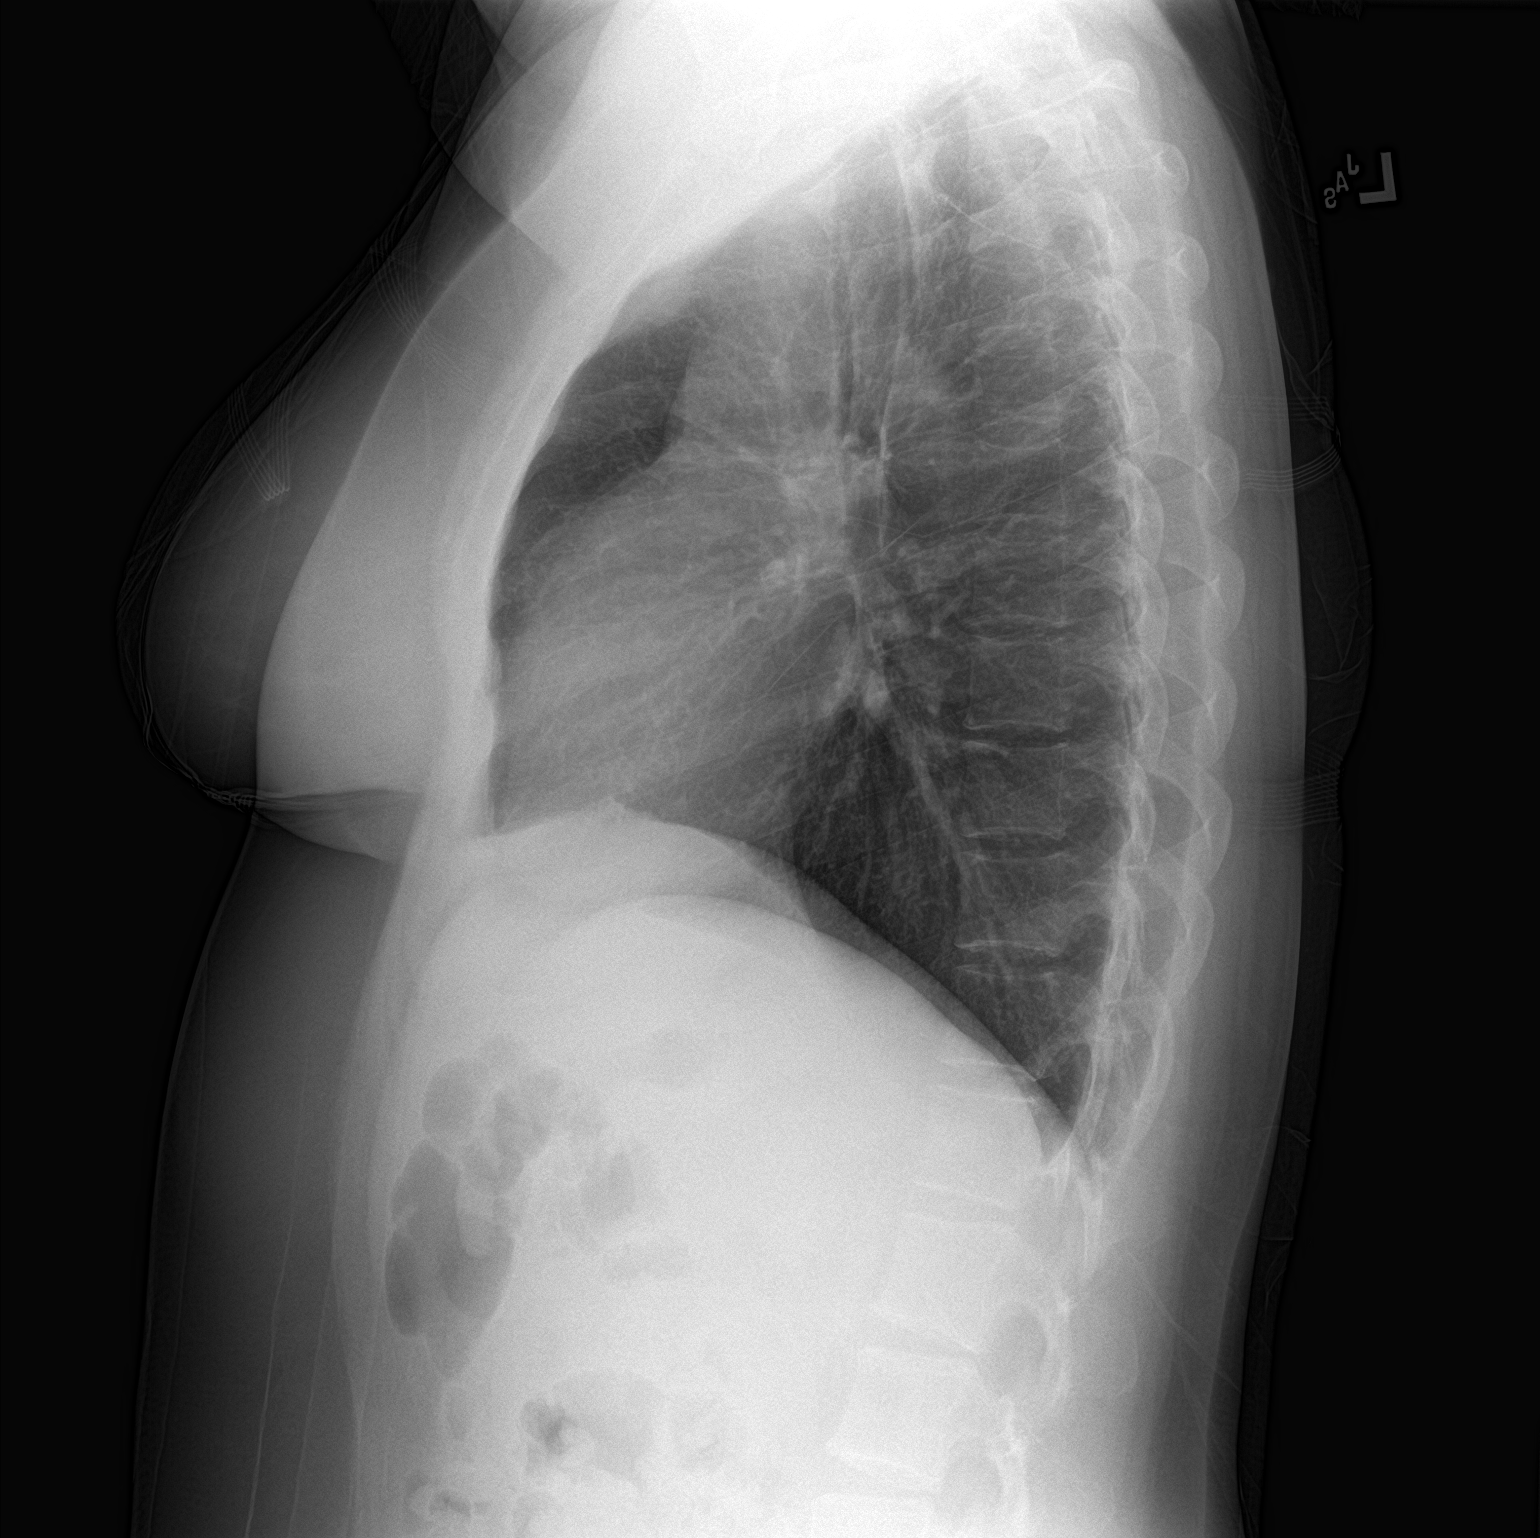

[2 of 2 positions shown; findings below may reference images not displayed]

FINDINGS: The heart size and mediastinal contours are within normal limits.
Both lungs are clear. The visualized skeletal structures are
unremarkable.
IMPRESSION: No active cardiopulmonary disease.
